# Patient Record
Sex: Female | Born: 1965 | Race: Black or African American | Hispanic: No | Marital: Married | Smoking: Never smoker
Health system: Southern US, Community
[De-identification: ages and names within clinical notes are randomized; demographics above are authoritative.]

## PROBLEM LIST (undated history)

## (undated) DIAGNOSIS — T8859XA Other complications of anesthesia, initial encounter: Secondary | ICD-10-CM

## (undated) DIAGNOSIS — T4145XA Adverse effect of unspecified anesthetic, initial encounter: Secondary | ICD-10-CM

## (undated) DIAGNOSIS — R112 Nausea with vomiting, unspecified: Secondary | ICD-10-CM

## (undated) DIAGNOSIS — G5600 Carpal tunnel syndrome, unspecified upper limb: Secondary | ICD-10-CM

## (undated) DIAGNOSIS — R7309 Other abnormal glucose: Secondary | ICD-10-CM

## (undated) DIAGNOSIS — D72819 Decreased white blood cell count, unspecified: Secondary | ICD-10-CM

## (undated) DIAGNOSIS — N3289 Other specified disorders of bladder: Secondary | ICD-10-CM

## (undated) DIAGNOSIS — Z789 Other specified health status: Secondary | ICD-10-CM

## (undated) DIAGNOSIS — E010 Iodine-deficiency related diffuse (endemic) goiter: Secondary | ICD-10-CM

## (undated) DIAGNOSIS — N2889 Other specified disorders of kidney and ureter: Secondary | ICD-10-CM

## (undated) DIAGNOSIS — R2240 Localized swelling, mass and lump, unspecified lower limb: Secondary | ICD-10-CM

## (undated) DIAGNOSIS — D219 Benign neoplasm of connective and other soft tissue, unspecified: Secondary | ICD-10-CM

## (undated) DIAGNOSIS — G4730 Sleep apnea, unspecified: Secondary | ICD-10-CM

## (undated) DIAGNOSIS — Z9889 Other specified postprocedural states: Secondary | ICD-10-CM

## (undated) HISTORY — PX: ABDOMINAL HYSTERECTOMY: SHX81

## (undated) HISTORY — DX: Decreased white blood cell count, unspecified: D72.819

## (undated) HISTORY — PX: DIAGNOSTIC LAPAROSCOPY: SUR761

## (undated) HISTORY — DX: Benign neoplasm of connective and other soft tissue, unspecified: D21.9

## (undated) HISTORY — DX: Other abnormal glucose: R73.09

## (undated) HISTORY — DX: Other specified disorders of kidney and ureter: N28.89

## (undated) HISTORY — DX: Other specified disorders of bladder: N32.89

## (undated) HISTORY — DX: Iodine-deficiency related diffuse (endemic) goiter: E01.0

## (undated) HISTORY — DX: Sleep apnea, unspecified: G47.30

---

## 1993-09-06 DIAGNOSIS — D72819 Decreased white blood cell count, unspecified: Secondary | ICD-10-CM

## 1993-09-06 HISTORY — DX: Decreased white blood cell count, unspecified: D72.819

## 1997-11-27 ENCOUNTER — Ambulatory Visit (HOSPITAL_COMMUNITY): Admission: RE | Admit: 1997-11-27 | Discharge: 1997-11-27 | Payer: Self-pay | Admitting: Obstetrics and Gynecology

## 1997-12-18 ENCOUNTER — Ambulatory Visit (HOSPITAL_COMMUNITY): Admission: RE | Admit: 1997-12-18 | Discharge: 1997-12-18 | Payer: Self-pay | Admitting: Obstetrics and Gynecology

## 1998-03-08 HISTORY — PX: OTHER SURGICAL HISTORY: SHX169

## 1998-04-02 ENCOUNTER — Inpatient Hospital Stay (HOSPITAL_COMMUNITY): Admission: RE | Admit: 1998-04-02 | Discharge: 1998-04-04 | Payer: Self-pay | Admitting: Obstetrics and Gynecology

## 1998-09-11 ENCOUNTER — Other Ambulatory Visit: Admission: RE | Admit: 1998-09-11 | Discharge: 1998-09-11 | Payer: Self-pay | Admitting: Obstetrics and Gynecology

## 1999-10-03 ENCOUNTER — Other Ambulatory Visit: Admission: RE | Admit: 1999-10-03 | Discharge: 1999-10-03 | Payer: Self-pay | Admitting: Obstetrics & Gynecology

## 1999-11-17 ENCOUNTER — Encounter: Payer: Self-pay | Admitting: Obstetrics and Gynecology

## 1999-11-17 ENCOUNTER — Ambulatory Visit (HOSPITAL_COMMUNITY): Admission: RE | Admit: 1999-11-17 | Discharge: 1999-11-17 | Payer: Self-pay | Admitting: Obstetrics and Gynecology

## 2000-01-20 ENCOUNTER — Inpatient Hospital Stay (HOSPITAL_COMMUNITY): Admission: AD | Admit: 2000-01-20 | Discharge: 2000-01-20 | Payer: Self-pay | Admitting: Obstetrics and Gynecology

## 2000-04-09 ENCOUNTER — Inpatient Hospital Stay (HOSPITAL_COMMUNITY): Admission: AD | Admit: 2000-04-09 | Discharge: 2000-04-12 | Payer: Self-pay | Admitting: Obstetrics and Gynecology

## 2000-04-14 ENCOUNTER — Encounter: Admission: RE | Admit: 2000-04-14 | Discharge: 2000-05-18 | Payer: Self-pay | Admitting: Obstetrics and Gynecology

## 2000-10-25 ENCOUNTER — Other Ambulatory Visit: Admission: RE | Admit: 2000-10-25 | Discharge: 2000-10-25 | Payer: Self-pay | Admitting: Obstetrics and Gynecology

## 2001-02-15 ENCOUNTER — Encounter: Payer: Self-pay | Admitting: Obstetrics and Gynecology

## 2001-02-15 ENCOUNTER — Encounter: Admission: RE | Admit: 2001-02-15 | Discharge: 2001-02-15 | Payer: Self-pay | Admitting: Obstetrics and Gynecology

## 2002-01-10 ENCOUNTER — Other Ambulatory Visit: Admission: RE | Admit: 2002-01-10 | Discharge: 2002-01-10 | Payer: Self-pay | Admitting: Obstetrics and Gynecology

## 2002-06-21 ENCOUNTER — Ambulatory Visit (HOSPITAL_COMMUNITY): Admission: RE | Admit: 2002-06-21 | Discharge: 2002-06-21 | Payer: Self-pay | Admitting: Obstetrics and Gynecology

## 2002-06-21 ENCOUNTER — Encounter: Payer: Self-pay | Admitting: Obstetrics and Gynecology

## 2002-09-04 ENCOUNTER — Encounter: Payer: Self-pay | Admitting: Obstetrics and Gynecology

## 2002-09-04 ENCOUNTER — Inpatient Hospital Stay (HOSPITAL_COMMUNITY): Admission: RE | Admit: 2002-09-04 | Discharge: 2002-09-04 | Payer: Self-pay | Admitting: Obstetrics and Gynecology

## 2002-11-16 ENCOUNTER — Inpatient Hospital Stay (HOSPITAL_COMMUNITY): Admission: AD | Admit: 2002-11-16 | Discharge: 2002-11-19 | Payer: Self-pay | Admitting: Obstetrics and Gynecology

## 2003-01-12 ENCOUNTER — Other Ambulatory Visit: Admission: RE | Admit: 2003-01-12 | Discharge: 2003-01-12 | Payer: Self-pay | Admitting: Obstetrics and Gynecology

## 2004-02-01 ENCOUNTER — Other Ambulatory Visit: Admission: RE | Admit: 2004-02-01 | Discharge: 2004-02-01 | Payer: Self-pay | Admitting: Obstetrics and Gynecology

## 2004-02-14 ENCOUNTER — Encounter: Admission: RE | Admit: 2004-02-14 | Discharge: 2004-02-14 | Payer: Self-pay | Admitting: Obstetrics and Gynecology

## 2005-02-18 ENCOUNTER — Encounter: Admission: RE | Admit: 2005-02-18 | Discharge: 2005-02-18 | Payer: Self-pay | Admitting: *Deleted

## 2005-02-18 ENCOUNTER — Other Ambulatory Visit: Admission: RE | Admit: 2005-02-18 | Discharge: 2005-02-18 | Payer: Self-pay | Admitting: *Deleted

## 2005-06-08 HISTORY — PX: TOTAL ABDOMINAL HYSTERECTOMY: SHX209

## 2006-02-19 ENCOUNTER — Encounter: Admission: RE | Admit: 2006-02-19 | Discharge: 2006-02-19 | Payer: Self-pay | Admitting: Obstetrics and Gynecology

## 2006-02-19 ENCOUNTER — Other Ambulatory Visit: Admission: RE | Admit: 2006-02-19 | Discharge: 2006-02-19 | Payer: Self-pay | Admitting: Obstetrics and Gynecology

## 2006-05-08 DIAGNOSIS — D219 Benign neoplasm of connective and other soft tissue, unspecified: Secondary | ICD-10-CM

## 2006-05-08 HISTORY — DX: Benign neoplasm of connective and other soft tissue, unspecified: D21.9

## 2006-05-11 ENCOUNTER — Encounter (INDEPENDENT_AMBULATORY_CARE_PROVIDER_SITE_OTHER): Payer: Self-pay | Admitting: *Deleted

## 2006-05-11 ENCOUNTER — Inpatient Hospital Stay (HOSPITAL_COMMUNITY): Admission: RE | Admit: 2006-05-11 | Discharge: 2006-05-13 | Payer: Self-pay | Admitting: Obstetrics and Gynecology

## 2007-04-22 ENCOUNTER — Encounter: Admission: RE | Admit: 2007-04-22 | Discharge: 2007-04-22 | Payer: Self-pay | Admitting: Obstetrics and Gynecology

## 2008-05-11 ENCOUNTER — Encounter: Admission: RE | Admit: 2008-05-11 | Discharge: 2008-05-11 | Payer: Self-pay | Admitting: Obstetrics and Gynecology

## 2008-05-11 ENCOUNTER — Other Ambulatory Visit: Admission: RE | Admit: 2008-05-11 | Discharge: 2008-05-11 | Payer: Self-pay | Admitting: Obstetrics & Gynecology

## 2009-05-17 ENCOUNTER — Encounter: Admission: RE | Admit: 2009-05-17 | Discharge: 2009-05-17 | Payer: Self-pay | Admitting: Obstetrics and Gynecology

## 2010-06-04 ENCOUNTER — Encounter
Admission: RE | Admit: 2010-06-04 | Discharge: 2010-06-04 | Payer: Self-pay | Source: Home / Self Care | Attending: Obstetrics and Gynecology | Admitting: Obstetrics and Gynecology

## 2010-10-24 NOTE — Discharge Summary (Signed)
   NAME:  Arellano Arellano MCCLEES                    ACCOUNT NO.:  [id number]   MEDICAL RECORD NO.:  [id number]                   PATIENT TYPE:  INP   LOCATION:  9112                                 FACILITY:  [facility]   PHYSICIAN:  Rica Koyanagi, C.N.M.         DATE OF BIRTH:  16-Sep-1965   DATE OF ADMISSION:  11/16/2002  DATE OF DISCHARGE:  11/19/2002                                 DISCHARGE SUMMARY   ADMISSION DIAGNOSES:  1. Intrauterine pregnancy at term.  2. Prior cesarean section.  3. Desires repeat cesarean section.  4. Prior myomectomy.   PROCEDURE:  Repeat low transverse cesarean section.   DISCHARGE DIAGNOSES:  1. Intrauterine pregnancy at term.  2. Prior cesarean section.  3. Desires repeat cesarean section.  4. Prior myomectomy.   HISTORY OF PRESENT ILLNESS:  Arellano Arellano is a 45 year old gravida 2, para  1, who presents at term with a history of prior myomectomy and prior  cesarean section for repeat cesarean section.   HOSPITAL COURSE:  This was performed by Dr. Marline Backbone with the birth  of an 8 pound 4 ounce female infant with Apgar scores of 9 at one minute and 9  at five minutes.  Both the patient and infant have done well in the  immediate postoperative period.  Baby is breast-feeding.  Mother's vital  signs have been stable.  Her hemoglobin on the first postoperative day was  10.6.  She has remained stable.  Her incision is clean and intact.  On this  her third postoperative day, she is judged to be in satisfactory condition  for discharge.   DISCHARGE INSTRUCTIONS:  Per [facility] handout.   DISCHARGE MEDICATIONS:  1. Motrin 600 mg p.o. q.6h. p.r.n. pain.  2. Tylox one or two p.o. q.3-4h. p.r.n. pain.  3. Prenatal vitamins.   FOLLOWUP:  At [facility] in six weeks.                                               Rica Koyanagi, C.N.M.    SDM/MEDQ  D:  11/19/2002  T:  11/19/2002  Job:  [id number] [id number]

## 2010-10-24 NOTE — Discharge Summary (Signed)
NAME:  Danielle Arellano, Danielle Arellano          ACCOUNT NO.:  [id number]   MEDICAL RECORD NO.:  [id number]          PATIENT TYPE:  INP   LOCATION:  9312                          [facility]   PHYSICIAN:  Cynthia P. Romine, M.D.DATE OF BIRTH:  1965-12-31   DATE OF ADMISSION:  05/11/2006  DATE OF DISCHARGE:  05/13/2006                               DISCHARGE SUMMARY   DISCHARGE DIAGNOSIS:  Multiple leiomyomas.   HISTORY OF PRESENT ILLNESS:  This is a 45 year old married black female  gravida 2, para 2, with a 20 week size uterus with fibroids on pelvic  ultrasound.  She was complaining of abdominal and pelvic pressure and  bloating and feeling of a huge abdomen.  She was amenorrheic with a  Mirena in place that the physician had been unable to retrieve in the  office.   HOSPITAL COURSE:  On May 11, 2006 the patient underwent a total  abdominal hysterectomy with lysis of adhesions.  There was an estimated  blood loss of 250 cc.  There were no complications.  Postoperatively,  the patient did very well.  She had no postoperative complications and  was sent home afebrile and in good condition on postoperative day #2.  She had been given a prescription for Percocet 5 mg to use at home for  postoperative pain.  Her pathology report did confirm leiomyomata.  The  uterus weighed 924 grams.   LABORATORY DATA:  On admission her H and H were 14 and 41 and on  discharge were 11.3 and 32.6.      Cynthia P. Romine, M.D.  Electronically Signed     CPR/MEDQ  D:  07/08/2006  T:  07/08/2006  Job:  [id number]

## 2010-10-24 NOTE — Op Note (Signed)
[facility]  Patient:    Danielle Arellano, Danielle Arellano                 MRN: [id number] Adm. Date:  [id number] Attending:  Leonard Schwartz                           Operative Report  PREOPERATIVE DIAGNOSES:       1. Term intrauterine pregnancy.                               2. Prior abdominal myomectomy.  POSTOPERATIVE DIAGNOSES:      1. Term intrauterine pregnancy.                               2. Prior abdominal myomectomy.  PROCEDURE:                    Primary low transverse cesarean section.  SURGEON:                      Janine Limbo, M.D.  FIRST ASSISTANT:              Sherald Barge, C.N.M.  ANESTHESIA:                   Spinal.  DISPOSITION:                  The patient is a 45 year old female gravida 1, para 0 who presents at [redacted] weeks gestation [facility] April 17, 2000) for cesarean delivery.  She has had a prior myomectomy with deep penetration of the myometrium.  She understands the potential for a vaginal delivery, but also understands the risks associated with vaginal delivery following such a myomectomy.  She has elected to proceed with cesarean section.  The risks associated with cesarean section were reviewed including, but not limited to, anesthetic complications, bleeding, infections, and possible damage to the surrounding organs.  FINDINGS:                     A 6 pound 3 ounce female infant (Lela) was delivered from a cephalic position.  The Apgars were 8 at one minute and 9 at five minutes.  There were small fibroids present on the left fundus of the uterus.  The ovaries were normal to palpation.  There were filmy adhesions between the bowel and the fundus of the uterus.  PROCEDURE:                    The patient was taken to the operating room where a spinal anesthetic was given.  The patients abdomen, perineum, and vagina were prepped with multiple layers of Betadine.  A Foley catheter was placed in the bladder.  The  patient was sterilely draped.  A low transverse incision was made in the abdomen and carried sharply through the subcutaneous tissue, the fascia, and the anterior peritoneum.  An incision was made in the lower uterine segment and extended transversely.  The fetal head was delivered with the assistance of a Mighty Vac vacuum extractor.  The mouth and nose were suctioned.  A nuchal cord was reduced.  The remainder of the infant was delivered.  The cord was clamped and cut and the infant was handed to the awaiting pediatric  team.  Routine cord blood studies were obtained.  The placenta was removed.  The uterine cavity was cleaned of amniotic fluid and clotted blood.  The uterine incision was closed using a running locking suture of 2-0 Vicryl.  Hemostasis was adequate.  The peritoneal cavity was irrigated. The anterior peritoneum and the abdominal musculature were reapproximated using 2-0 Vicryl.  The fascia was closed using a running suture of 0 Vicryl followed by three interrupted sutures of 0 Vicryl.  The subcutaneous tissue was irrigated.  Hemostasis was adequate.  The skin was reapproximated using skin staples.  Sponge, needle, and instrument counts were correct on two occasions.  Estimated blood loss was 600 cc.  The patient tolerated her procedure well.  She was taken to the recovery room in stable condition.  The infant was taken to the full-term nursery in stable condition. DD:  04/09/00 TD:  04/09/00 Job: [id number]

## 2010-10-24 NOTE — H&P (Signed)
[facility]  Patient:    Danielle Arellano, Danielle Arellano              MRN: [id number] Adm. Date:  [id number] Disc. Date: [id number] Attending:  Shaune Spittle                         History and Physical  HISTORY OF PRESENT ILLNESS:   The patient is a 45 year old female, gravida 1, para 0, who presents at [redacted] weeks gestation, [facility] April 17, 2000, for a cesarean delivery.  The patient had been followed at [facility] for this pregnancy, that has been complicated by a history of fibroids.  She is status post a myomectomy two years ago.  Her age is also greater than 35, but she declined an amniocentesis.  PAST MEDICAL HISTORY:         The patient has a known history of low white blood cells.  The patient had her wisdom teeth removed approximately 15 years ago, in addition to the myomectomy that was performed in 1999.  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  She denies cigarette use, alcohol use, and recreational drug use.  REVIEW OF SYSTEMS:            Normal pregnancy complaints.  FAMILY HISTORY:               The patient has a family history of emphysema and seizure disorders.  PHYSICAL EXAMINATION:  GENERAL:                      Weight is 161 pounds.  HEENT:                        Within normal limits.  CHEST:                        Clear.  HEART:                        A regular rate and rhythm.  BREASTS:                      Without masses.  ABDOMEN:                      Gravid with a fundal height of 37 cm.  EXTREMITIES:                  Within normal limits.  NEUROLOGIC:                   Grossly normal.  PELVIC:                       Cervix was closed and long when it was last checked.  LABORATORY DATA:              Blood type is O-negative.  (The patient received RhoGAM at 28 weeks.)  Antibody screen negative.  Sickle cell trait  negative. VDRL nonreactive.  Rubella immune.  HBSAG negative.  GC negative.  Chlamydia negative.  Pap smear within normal limits.  Alpha fetoprotein within normal limits.  Third trimester beta Streptococcus screen negative.  ASSESSMENT:  1. A 39-week gestation.                               2. Prior myomectomy.                               3. Fibroids.                               4. Age greater than 35.                               5. Blood type O-negative.  PLAN:                         The patient will undergo a low transverse cesarean section.  She understands the indications for her procedure, and she accepts the risks of, but not limited to, anesthetic complications, bleeding, infections, and possible damage to the surrounding organs.  The patient understands that we will check the infants blood type, and she may need RhoGAM. DD:  04/08/00 TD:  04/08/00 Job: [id number]

## 2010-10-24 NOTE — Op Note (Signed)
NAME:  Danielle Arellano, Danielle Arellano NO.:  [id number]   MEDICAL RECORD NO.:  [id number]                   PATIENT TYPE:  INP   LOCATION:  9199                                 [facility]   PHYSICIAN:  Janine Limbo, M.D.            DATE OF BIRTH:  03-26-1966   DATE OF PROCEDURE:  11/16/2002  DATE OF DISCHARGE:                                 OPERATIVE REPORT   PREOPERATIVE DIAGNOSES:  1. Term intrauterine pregnancy.  2. Prior cesarean section.  3. Prior myomectomy.  4. Desires repeat cesarean section.   POSTOPERATIVE DIAGNOSES:  1. Term intrauterine pregnancy.  2. Prior cesarean section.  3. Prior myomectomy.  4. Desires repeat cesarean section.   PROCEDURE:  Repeat low transverse cesarean section.   SURGEON:  Janine Limbo, M.D.   FIRST ASSISTANT:  Renaldo Reel. Emilee Hero, C.N.M.   ANESTHESIA:  Spinal.   DISPOSITION:  Danielle Arellano is a 45 year old female, gravida 2, para 1-0-0-1,  who presents with the above mentioned diagnosis.  She understands the  indications for her procedure and she accepts the risk of, but not limited  to, anesthetic complications, bleeding, infections, and possible damage to  the surrounding organs.   FINDINGS:  An 8 pound 4 ounce female infant (name currently not known) was  delivered from a cephalic  presentation.  The Apgars were 9 at one minute  and 9 at five minutes.  There were adhesions noted between the uterus and  the adnexa.  Otherwise the tubes and ovaries appeared normal.   DESCRIPTION OF PROCEDURE:  The patient was taken to the operating room where  a spinal anesthetic was given.  The patient's abdomen, perineum, and outer  vagina were prepped with multiple layers of Betadine.  A Foley catheter was  placed in the bladder.  The patient was sterilely draped.  A low transverse  incision was made in the abdomen and carried sharply through the  subcutaneous tissue, the fascia, and the anterior peritoneum.  An  incision  was made in the lower uterine segment and extended transversely.  Fetal head  was delivered with the assistance of a Mityvac vacuum extractor.  The mouth  and nose were suctioned.  The remainder of the infant was delivered.  The  cord was clamped and cut and the infant was handed to the awaiting pediatric  team.  Routine cord blood studies were obtained.  The placenta was removed.  The uterine cavity was cleaned of amniotic fluid and clotted blood.  The  uterine incision was closed using a running locking suture of 2-0 Vicryl  followed by an imbricating suture of 2-0 Vicryl.  Hemostasis was adequate.  The pelvic cavity was vigorously irrigated.  The anterior peritoneum and the  abdominal musculature were reapproximated in the midline using interrupted  sutures of 0 Vicryl.  The abdominal musculature, the fascia, and the  subcutaneous layer were irrigated.  Hemostasis was adequate.  The fascia was  closed using a running suture of 0 Vicryl followed by three interrupted  sutures of 0 Vicryl.  The subcutaneous layer was closed using a running  suture of 2-0 Vicryl.  The skin was reapproximated using subcuticular suture  of 4-0 Vicryl.  Sponge, needle and instrument counts were correct on two  occasions.  The estimated blood loss was 800 mL.  The patient tolerated the  procedure well.  She was taken to the recovery room in stable condition.  The infant was taken to the full-term nursery in stable condition.                                               Janine Limbo, M.D.    AVS/MEDQ  D:  11/16/2002  T:  11/16/2002  Job:  [id number]   cc:   Laqueta Linden, M.D.  [address]  [place]  [place] [address]  Fax: [phone]

## 2010-10-24 NOTE — Discharge Summary (Signed)
[facility]  Patient:    Danielle, Arellano                 MRN: [id number] Adm. Date:  [id number] Disc. Date: 04/12/00 Attending:  Leonard Schwartz Dictator:   Miguel Dibble, C.N.M.                           Discharge Summary  DATE OF BIRTH:                Sep 28, 1965.  ADMISSION DIAGNOSES:          1. Term pregnancy.                               2. Prior myomectomy.                               3. Primigravida.  DISCHARGE DIAGNOSES:          1. Term pregnancy.                               2. Prior myomectomy.                               3. Primigravida.                               4. Delivered by primary cesarean section, viable                                  baby girl weighing 6 pounds 3 ounces, Apgars                                  of 8 and 9.                               5. Small fibroid on uterus.                               6. Adhesions of the bowel.  PROCEDURES:                   1. Spinal anesthesia.                               2. Primary lower segment transverse cesarean                                  section.  HOSPITAL COURSE:              On April 09, 2000, Danielle Arellano was admitted for a planned primary cesarean section for a history of a previous myomectomy.  She is a primigravida.  She delivered a baby girl, uncomplicated, weighing 6 pounds 3 ounces, Apgars of 8 and 9.  A small fibroid was  noted on her uterus, with adhesions to the bowel.  She recovered well.  On postoperative day #1, she was ambulating without difficulty, tolerating clear fluids, breast-feeding well.  She planned oral contraceptive pills for contraception while breast-feeding.  On postoperative day #1, hemoglobin had dropped from 13 to 10, hematocrit 27.8, platelets 195, white count 9.6.  On postoperative day #2, her incision was clean, dry, and intact.  The baby was breast-feeding well.  She was afebrile.  On postoperative  day #3, on April 12, 2000, she continued to recover well and was discharged home in stable condition after her staples were removed and Steri-Strips applied.  DISCHARGE INSTRUCTIONS:       Per [employer].  DISCHARGE MEDICATIONS:        Motrin 600 mg, Tylox, Micronor, prenatal vitamins.  FOLLOW-UP:                    In six weeks at [facility].DD: 04/12/00 TD:  04/12/00 Job: [id number]

## 2010-10-24 NOTE — H&P (Signed)
NAME:  Halterman, ANGELISSE RISO NO.:  [id number]   MEDICAL RECORD NO.:  [id number]                   PATIENT TYPE:  INP   LOCATION:  NA                                   [facility]   PHYSICIAN:  Janine Limbo, M.D.            DATE OF BIRTH:  August 24, 1965   DATE OF ADMISSION:  11/16/2002  DATE OF DISCHARGE:                                HISTORY & PHYSICAL   HISTORY OF PRESENT ILLNESS:  Ms. Hogland is a 45 year old female, gravida  2, para 1-0-0-1, who presents at [redacted] weeks gestation (EDC is November 21, 2002)  for repeat cesarean section.  The patient has been followed at the [facility].  Her pregnancy has been largely uncomplicated.  She has had a prior  cesarean section, and a prior myomectomy.  She plans a repeat cesarean  section.  The patient is RH negative, and she did receive RhoGAM at [redacted] weeks  gestation.   OBSTETRICAL HISTORY:  In 2001, the patient had a primary cesarean section  because of a prior myomectomy.  She delivered a 6 pound 3 ounce female  infant (Lela).   ALLERGIES:  No known drug allergies.   PAST MEDICAL HISTORY:  The patient denies hypertension and diabetes.  She  had her wisdom teeth removed when she was 45 years old.  She had a prior a  myomectomy in 1999, and a cesarean section in 2001.   SOCIAL HISTORY:  The patient denies cigarette use, alcohol use, an  recreational drug use.  She is a married Engineer, site.   REVIEW OF SYMPTOMS:  Normal pregnancy complaints.   FAMILY HISTORY:  Noncontributory.   PHYSICAL EXAMINATION:  VITAL SIGNS:  Weight is 169 pounds.  HEENT:  Within normal limits.  CHEST:  Clear.  HEART:  Regular rate and rhythm.  BREASTS:  Without masses.  ABDOMEN:  Gravid with a fundal height of 37 cm.  EXTREMITIES:  Within normal limits.  NEUROLOGIC:  Grossly normal.  PELVIC:  Cervix is closed and long.   LABORATORY DATA:  Blood  type is O negative.  Antibody screen is negative.  Sickle cell trait negative.  VDRL nonreactive.  Rubella immune.  Hepatitis B  surface antigen negative.  Glucola screen within normal limits.  Alpha  fetoprotein screen within normal limits.  Third trimester beta Strep  negative.  Third trimester gonorrhea negative.  Third trimester Chlamydia  negative.   ASSESSMENT:  1. A [redacted] week gestation.  2. Prior cesarean section.  3. Desires repeat cesarean section.  4. Prior myomectomy.  5. Rh negative.   PLAN:  The patient will undergo a repeat low transverse cesarean section.  She is considering an IUD for contraception.  We will check the infant's  blood type, and then determine whether the mother needs RhoGAM.  Janine Limbo, M.D.    AVS/MEDQ  D:  11/12/2002  T:  11/12/2002  Job:  [id number]   cc:   Laqueta Linden, M.D.  [address]  [place]  [place] [address]  Fax: [phone]   Dellis Anes. Idell Pickles, M.D.  [address]  [place]  [place] [address]  Fax: [phone]

## 2010-10-24 NOTE — Op Note (Signed)
NAME:  Danielle Arellano, Danielle Arellano          ACCOUNT NO.:  [id number]   MEDICAL RECORD NO.:  [id number]          PATIENT TYPE:  INP   LOCATION:  9312                          FACILITY:  [facility]   PHYSICIAN:  Cynthia P. Romine, M.D.DATE OF BIRTH:  December 27, 1965   DATE OF PROCEDURE:  05/11/2006  DATE OF DISCHARGE:                               OPERATIVE REPORT   PREOPERATIVE DIAGNOSIS:  Uterine fibroids, symptomatic, with protuberant  abdomen, pelvic pressure, abdominal bloating.   POSTOPERATIVE DIAGNOSIS:  Uterine fibroids, symptomatic, with  protuberant abdomen, pelvic pressure, abdominal bloating, path pending.   PROCEDURE:  Total abdominal hysterectomy, extensive lysis of adhesions.   SURGEON:  Dr. Arline Asp Romine.   ASSISTANT:  Dr. Leda Quail.   ANESTHESIA:  General endotracheal.   ESTIMATED BLOOD LOSS:  250 mL.   COMPLICATIONS:  None.   PROCEDURE:  The patient was taken to the recovery room and, after she  was given IV sedation, she was prepped and draped, and Foley catheter  inserted, and then anesthesia was induced.  The anesthesiologist was  slightly held up in another situation and, therefore, the patient was  prepped prior to the induction of anesthesia.  Anesthesia was induced.  The patient was then draped.  A vertical incision was made in the  symphysis to below the umbilicus.  The incision was carried down to the  fascia using the Bovie.  The fascia was nicked and opened vertically.  The rectus muscles were separated bluntly in the midline.  Underlying  peritoneum was entered atraumatically.  Peritoneum was opened  vertically.  The patient had had a previous myomectomy which involved  multiple myomata, and had two previous C-sections.  Her uterus was above  the umbilicus, was multinodular.  The right ovary and tube were densely  adherent to the sidewall of the uterus.  The fimbria of the tube  initially was not even visible until after dissecting away some of the  adhesions.   The round ligaments were not visible as well.  There was a  large amount of small intestine densely adherent to the fundus and  posterior surface of the uterus on the right.  On the left there were  adhesions of the colon and the small bowel to the uterus.  As well, the  bladder was adherent up high onto the uterine fundus.  Very careful  dissection was used to remove the bowel that was adherent to the uterus.  It was quite tedious and time-consuming.  The bowel was finally freed  off the right side of the uterus.  There was one section of small  intestine where there was a question if there was a break in the serosa  of the small bowel from the dissection, and in this piece of small bowel  a very superficial figure-of-eight suture of 2-0 silk was placed, just  for reinforcement of the serosa.  It may have been even not necessary.  It was just done for added safety.  The ovary and tube were still  densely adherent to the uterus and, as the patient is 45 years old and  wanting to keep her ovaries, extensive  dissection was necessary to  remove the tube and ovary from the uterus, identifying the  infundibulopelvic ligament and protecting it.  Ureter was identified.  On the left the adhesions were taken down sharply as well, and the left  side then could be put on tension and, when that was done, the round  ligament could be identified.  Round ligament was very attenuated,  presumably because the large size of the uterus.  When the uterus was  put on tension, the round ligament could be identified, was elevated  with Kelly clamp, stitched and divided with the Bovie.  This allowed the  anterior leaf of broad ligament to be taken down sharply and, after  dissecting some adhesions from the tube and ovary on the left of the  uterus, a window could be obtained and the mesosalpinx and a Heaney  clamp was used to clamp the pedicle containing the utero-ovarian, tube  and round ligaments, cut and  doubly tied.  The bladder was dissected  with difficulty off the uterus.  There were thick and multiple adhesions  of the bladder up onto the uterus and the surrounding fibroids.  On the  left, the uterine artery was skeletonized, doubly clamped and doubly  tied.  On the right, once the dissection was able to be completed,  freeing the ovary and tube from the uterus, and we could put traction on  the uterus towards the left, we could identify the right round ligament  which was elevated with Tresa Endo.  It was attenuated as well.  It was  stitched, opened with the Bovie.  Anterior leaf of broad ligament taken  down sharply.  This helped find the plane that enabled [place] to take down  the bladder off the cervix with the fibroid.  A window was then made in  the mesosalpinx of the pedicle containing the right tube, utero-ovarian  ligament and round was doubly clamped, cut and doubly tied.  The uterine  artery on the right was then skeletonized, doubly clamped and doubly  tied.  There were cervical fibroids, especially on the right, that made  continuing down the cardinal ligaments difficult.  The adhesions from  the sidewall to the fibroids were dissected free.  Straight Heaney were  used down the uterus on both sides, clamping, cutting and tying in  sequence.  On the left, I was able to clamp the uterosacral ligaments  with a curved Heaney and, as it was cut, the vagina was entered.  The  pedicle was held after it was sutured with 0 Vicryl.  The specimen was  then removed with Jorgenson's scissors, grasping the margins of the  vagina with Kochers.  Angle suture were placed at the right and left  angles.  The right uterine artery had been previously skeletonized,  clamped and doubly tied.  The vagina was closed with figure-of-eight  sutures of 0 Vicryl.  The pelvis was irrigated.  There were some small  bleeders that were controlled with the Bovie.  The pelvis was felt to be hemostatic.  There was  some oozing underneath the bladder where the  extensive dissection had been done, and a piece of Gelfoam was placed  here for hemostasis.  The ovaries were tied up to the round ligament.  The bowel was allowed to return to its anatomic position.  The  peritoneum was grasped with hemostats and closed in running fashion  using 2-0 Vicryl.  A subfascial On-Q catheter was placed.  The fascia  was then closed from the top and the bottom to the midline using 0  Vicryl in a running suture.  The subcutaneous On-Q catheter was placed.  Subcutaneous tissue was irrigated.  Hemostasis was achieved with the  Bovie.  The skin was approximated with subcuticular suture of 4-0 Vicryl  Rapide.  Benzoin and Steri-Strips were applied.  The On-Q catheters were  secured, and the procedure was terminated.  Sponge, needle and  instrument counts were correct x3.      Cynthia P. Romine, M.D.  Electronically Signed     CPR/MEDQ  D:  05/11/2006  T:  05/12/2006  Job:  [id number]

## 2011-06-19 ENCOUNTER — Other Ambulatory Visit: Payer: Self-pay | Admitting: Obstetrics and Gynecology

## 2011-06-19 DIAGNOSIS — Z1231 Encounter for screening mammogram for malignant neoplasm of breast: Secondary | ICD-10-CM

## 2011-06-30 ENCOUNTER — Ambulatory Visit: Payer: Self-pay

## 2011-07-03 ENCOUNTER — Ambulatory Visit
Admission: RE | Admit: 2011-07-03 | Discharge: 2011-07-03 | Disposition: A | Discharge status: Home / Self Care | Payer: BC Managed Care – PPO | Source: Ambulatory Visit | Attending: Obstetrics and Gynecology | Admitting: Obstetrics and Gynecology

## 2011-07-03 DIAGNOSIS — Z1231 Encounter for screening mammogram for malignant neoplasm of breast: Secondary | ICD-10-CM

## 2012-01-10 ENCOUNTER — Other Ambulatory Visit: Payer: Self-pay | Admitting: Orthopedic Surgery

## 2012-01-19 ENCOUNTER — Encounter (HOSPITAL_BASED_OUTPATIENT_CLINIC_OR_DEPARTMENT_OTHER): Payer: Self-pay | Admitting: *Deleted

## 2012-01-19 NOTE — Progress Notes (Signed)
No labs needed

## 2012-01-22 ENCOUNTER — Encounter (HOSPITAL_BASED_OUTPATIENT_CLINIC_OR_DEPARTMENT_OTHER): Payer: Self-pay | Admitting: Anesthesiology

## 2012-01-22 ENCOUNTER — Encounter (HOSPITAL_BASED_OUTPATIENT_CLINIC_OR_DEPARTMENT_OTHER): Payer: Self-pay | Admitting: Orthopedic Surgery

## 2012-01-22 ENCOUNTER — Encounter (HOSPITAL_BASED_OUTPATIENT_CLINIC_OR_DEPARTMENT_OTHER)
Admission: RE | Disposition: A | Discharge status: Home / Self Care | Payer: Self-pay | Source: Ambulatory Visit | Attending: Orthopedic Surgery

## 2012-01-22 ENCOUNTER — Ambulatory Visit (HOSPITAL_BASED_OUTPATIENT_CLINIC_OR_DEPARTMENT_OTHER): Payer: BC Managed Care – PPO | Admitting: Anesthesiology

## 2012-01-22 ENCOUNTER — Encounter (HOSPITAL_BASED_OUTPATIENT_CLINIC_OR_DEPARTMENT_OTHER): Payer: Self-pay | Admitting: *Deleted

## 2012-01-22 ENCOUNTER — Ambulatory Visit (HOSPITAL_BASED_OUTPATIENT_CLINIC_OR_DEPARTMENT_OTHER)
Admission: RE | Admit: 2012-01-22 | Discharge: 2012-01-22 | Disposition: A | Discharge status: Home / Self Care | Payer: BC Managed Care – PPO | Source: Ambulatory Visit | Attending: Orthopedic Surgery | Admitting: Orthopedic Surgery

## 2012-01-22 DIAGNOSIS — M6740 Ganglion, unspecified site: Secondary | ICD-10-CM | POA: Insufficient documentation

## 2012-01-22 DIAGNOSIS — R2240 Localized swelling, mass and lump, unspecified lower limb: Secondary | ICD-10-CM

## 2012-01-22 HISTORY — PX: MASS EXCISION: SHX2000

## 2012-01-22 HISTORY — DX: Other specified postprocedural states: Z98.890

## 2012-01-22 HISTORY — DX: Adverse effect of unspecified anesthetic, initial encounter: T41.45XA

## 2012-01-22 HISTORY — DX: Localized swelling, mass and lump, unspecified lower limb: R22.40

## 2012-01-22 HISTORY — DX: Nausea with vomiting, unspecified: R11.2

## 2012-01-22 HISTORY — DX: Other complications of anesthesia, initial encounter: T88.59XA

## 2012-01-22 HISTORY — DX: Other specified health status: Z78.9

## 2012-01-22 HISTORY — DX: Carpal tunnel syndrome, unspecified upper limb: G56.00

## 2012-01-22 SURGERY — EXCISION MASS
Anesthesia: General | Site: Foot | Laterality: Right | Wound class: Clean

## 2012-01-22 MED ORDER — OXYCODONE HCL 5 MG PO TABS: 5.0000 mg | ORAL_TABLET | Freq: Once | ORAL | Status: DC | PRN

## 2012-01-22 MED ORDER — OXYCODONE-ACETAMINOPHEN 5-325 MG PO TABS: 1.0000 | ORAL_TABLET | Freq: Four times a day (QID) | ORAL | Status: AC | PRN

## 2012-01-22 MED ORDER — CEFAZOLIN SODIUM-DEXTROSE 2-3 GM-% IV SOLR
2.0000 g | INTRAVENOUS | Status: AC
  Administered 2012-01-22: 2 g via INTRAVENOUS

## 2012-01-22 MED ORDER — FENTANYL CITRATE 0.05 MG/ML IJ SOLN
INTRAMUSCULAR | Status: DC | PRN
  Administered 2012-01-22 (×2): 25 ug via INTRAVENOUS
  Administered 2012-01-22: 50 ug via INTRAVENOUS

## 2012-01-22 MED ORDER — LACTATED RINGERS IV SOLN
INTRAVENOUS | Status: DC
  Administered 2012-01-22: 07:00:00 via INTRAVENOUS

## 2012-01-22 MED ORDER — KETOROLAC TROMETHAMINE 30 MG/ML IJ SOLN
INTRAMUSCULAR | Status: DC | PRN
  Administered 2012-01-22: 30 mg via INTRAVENOUS

## 2012-01-22 MED ORDER — LIDOCAINE HCL (CARDIAC) 20 MG/ML IV SOLN
INTRAVENOUS | Status: DC | PRN
  Administered 2012-01-22: 60 mg via INTRAVENOUS

## 2012-01-22 MED ORDER — MIDAZOLAM HCL 5 MG/5ML IJ SOLN
INTRAMUSCULAR | Status: DC | PRN
  Administered 2012-01-22: 2 mg via INTRAVENOUS

## 2012-01-22 MED ORDER — BUPIVACAINE HCL (PF) 0.5 % IJ SOLN
INTRAMUSCULAR | Status: DC | PRN
  Administered 2012-01-22: 10 mL

## 2012-01-22 MED ORDER — ONDANSETRON HCL 4 MG/2ML IJ SOLN
INTRAMUSCULAR | Status: DC | PRN
  Administered 2012-01-22: 4 mg via INTRAVENOUS

## 2012-01-22 MED ORDER — PROPOFOL 10 MG/ML IV EMUL
INTRAVENOUS | Status: DC | PRN
  Administered 2012-01-22: 30 mg via INTRAVENOUS
  Administered 2012-01-22: 200 mg via INTRAVENOUS

## 2012-01-22 MED ORDER — FENTANYL CITRATE 0.05 MG/ML IJ SOLN: 25.0000 ug | INTRAMUSCULAR | Status: DC | PRN

## 2012-01-22 MED ORDER — DEXAMETHASONE SODIUM PHOSPHATE 4 MG/ML IJ SOLN
INTRAMUSCULAR | Status: DC | PRN
  Administered 2012-01-22: 10 mg via INTRAVENOUS

## 2012-01-22 MED ORDER — PROMETHAZINE HCL 25 MG PO TABS: 25.0000 mg | ORAL_TABLET | Freq: Four times a day (QID) | ORAL | Status: DC | PRN

## 2012-01-22 MED ORDER — SCOPOLAMINE 1 MG/3DAYS TD PT72
1.0000 | MEDICATED_PATCH | Freq: Once | TRANSDERMAL | Status: DC
  Administered 2012-01-22: 1.5 mg via TRANSDERMAL

## 2012-01-22 MED ORDER — OXYCODONE HCL 5 MG/5ML PO SOLN: 5.0000 mg | Freq: Once | ORAL | Status: DC | PRN

## 2012-01-22 MED ORDER — 0.9 % SODIUM CHLORIDE (POUR BTL) OPTIME
TOPICAL | Status: DC | PRN
  Administered 2012-01-22: 1000 mL

## 2012-01-22 MED ORDER — METOCLOPRAMIDE HCL 5 MG/ML IJ SOLN: 10.0000 mg | Freq: Once | INTRAMUSCULAR | Status: DC | PRN

## 2012-01-22 MED ORDER — ACETAMINOPHEN 10 MG/ML IV SOLN
1000.0000 mg | Freq: Once | INTRAVENOUS | Status: AC
  Administered 2012-01-22: 1000 mg via INTRAVENOUS

## 2012-01-22 SURGICAL SUPPLY — 48 items
APPLICATOR SEPP 2% TINCTURE (FORM) ×1 IMPLANT
BANDAGE ELASTIC 3 VELCRO ST LF (GAUZE/BANDAGES/DRESSINGS) ×1 IMPLANT
BANDAGE ELASTIC 4 VELCRO ST LF (GAUZE/BANDAGES/DRESSINGS) ×1 IMPLANT
BLADE SURG 15 STRL LF DISP TIS (BLADE) ×1 IMPLANT
BLADE SURG 15 STRL SS (BLADE) ×2
BNDG CMPR 9X4 STRL LF SNTH (GAUZE/BANDAGES/DRESSINGS)
BNDG ESMARK 4X9 LF (GAUZE/BANDAGES/DRESSINGS) IMPLANT
CLOTH BEACON ORANGE TIMEOUT ST (SAFETY) ×2 IMPLANT
CORDS BIPOLAR (ELECTRODE) ×2 IMPLANT
COVER TABLE BACK 60X90 (DRAPES) ×2 IMPLANT
CUFF TOURNIQUET SINGLE 18IN (TOURNIQUET CUFF) IMPLANT
CUFF TOURNIQUET SINGLE 34IN LL (TOURNIQUET CUFF) ×1 IMPLANT
DRAPE EXTREMITY T 121X128X90 (DRAPE) ×2 IMPLANT
DRAPE SURG 17X23 STRL (DRAPES) ×2 IMPLANT
DRAPE U 20/CS (DRAPES) ×2 IMPLANT
DURAPREP 26ML APPLICATOR (WOUND CARE) ×2 IMPLANT
ELECT REM PT RETURN 9FT ADLT (ELECTROSURGICAL) ×2
ELECTRODE REM PT RTRN 9FT ADLT (ELECTROSURGICAL) IMPLANT
GLOVE BIO SURGEON STRL SZ 6.5 (GLOVE) ×1 IMPLANT
GLOVE BIO SURGEON STRL SZ7.5 (GLOVE) ×2 IMPLANT
GLOVE BIOGEL PI IND STRL 7.0 (GLOVE) IMPLANT
GLOVE BIOGEL PI IND STRL 8 (GLOVE) ×2 IMPLANT
GLOVE BIOGEL PI INDICATOR 7.0 (GLOVE) ×1
GLOVE BIOGEL PI INDICATOR 8 (GLOVE) ×2
GLOVE ORTHO TXT STRL SZ7.5 (GLOVE) ×2 IMPLANT
GLOVE SURG ORTHO 8.0 STRL STRW (GLOVE) ×2 IMPLANT
GOWN PREVENTION PLUS XLARGE (GOWN DISPOSABLE) ×3 IMPLANT
GOWN STRL REIN 2XL LVL4 (GOWN DISPOSABLE) ×1 IMPLANT
NDL HYPO 25X1 1.5 SAFETY (NEEDLE) ×1 IMPLANT
NEEDLE HYPO 25X1 1.5 SAFETY (NEEDLE) ×2 IMPLANT
NS IRRIG 1000ML POUR BTL (IV SOLUTION) ×2 IMPLANT
PACK BASIN DAY SURGERY FS (CUSTOM PROCEDURE TRAY) ×2 IMPLANT
PAD CAST 3X4 CTTN HI CHSV (CAST SUPPLIES) ×1 IMPLANT
PADDING CAST ABS 3INX4YD NS (CAST SUPPLIES)
PADDING CAST ABS 4INX4YD NS (CAST SUPPLIES)
PADDING CAST ABS COTTON 3X4 (CAST SUPPLIES) ×1 IMPLANT
PADDING CAST ABS COTTON 4X4 ST (CAST SUPPLIES) ×1 IMPLANT
PADDING CAST COTTON 3X4 STRL (CAST SUPPLIES)
PENCIL BUTTON HOLSTER BLD 10FT (ELECTRODE) ×1 IMPLANT
SLEEVE SCD COMPRESS KNEE MED (MISCELLANEOUS) ×1 IMPLANT
SPONGE GAUZE 4X4 12PLY (GAUZE/BANDAGES/DRESSINGS) ×2 IMPLANT
STRIP CLOSURE SKIN 1/2X4 (GAUZE/BANDAGES/DRESSINGS) ×1 IMPLANT
SUT ETHILON 4 0 PS 2 18 (SUTURE) ×1 IMPLANT
SYR BULB 3OZ (MISCELLANEOUS) ×2 IMPLANT
SYR CONTROL 10ML LL (SYRINGE) ×2 IMPLANT
TOWEL OR 17X24 6PK STRL BLUE (TOWEL DISPOSABLE) ×2 IMPLANT
UNDERPAD 30X30 INCONTINENT (UNDERPADS AND DIAPERS) ×2 IMPLANT
WATER STERILE IRR 1000ML POUR (IV SOLUTION) ×1 IMPLANT

## 2012-01-22 NOTE — Anesthesia Preprocedure Evaluation (Signed)
Anesthesia Evaluation  Patient identified by MRN, date of birth, ID band Patient awake    Reviewed: Allergy & Precautions, H&P , NPO status , Patient's Chart, lab work & pertinent test results, reviewed documented beta blocker date and time   History of Anesthesia Complications (+) PONV  Airway Mallampati: II TM Distance: >3 FB Neck ROM: full    Dental   Pulmonary neg pulmonary ROS,  breath sounds clear to auscultation        Cardiovascular negative cardio ROS  Rhythm:regular     Neuro/Psych  Neuromuscular disease negative psych ROS   GI/Hepatic negative GI ROS, Neg liver ROS,   Endo/Other  negative endocrine ROS  Renal/GU negative Renal ROS  negative genitourinary   Musculoskeletal   Abdominal   Peds  Hematology negative hematology ROS (+)   Anesthesia Other Findings See surgeon's H&P   Reproductive/Obstetrics negative OB ROS                           Anesthesia Physical Anesthesia Plan  ASA: II  Anesthesia Plan: General   Post-op Pain Management:    Induction: Intravenous  Airway Management Planned: LMA  Additional Equipment:   Intra-op Plan:   Post-operative Plan: Extubation in OR  Informed Consent: I have reviewed the patients History and Physical, chart, labs and discussed the procedure including the risks, benefits and alternatives for the proposed anesthesia with the patient or authorized representative who has indicated his/her understanding and acceptance.   Dental Advisory Given  Plan Discussed with: CRNA and Surgeon  Anesthesia Plan Comments:         Anesthesia Quick Evaluation

## 2012-01-22 NOTE — Transfer of Care (Signed)
Immediate Anesthesia Transfer of Care Note  Patient: Danielle Arellano  Procedure(s) Performed: Procedure(s) (LRB): EXCISION MASS (Right)  Patient Location: PACU  Anesthesia Type: General  Level of Consciousness: awake  Airway & Oxygen Therapy: Patient Spontanous Breathing and Patient connected to face mask oxygen  Post-op Assessment: Report given to PACU RN and Post -op Vital signs reviewed and stable  Post vital signs: Reviewed and stable  Complications: No apparent anesthesia complications

## 2012-01-22 NOTE — Op Note (Signed)
01/22/2012  8:33 AM  PATIENT:  Danielle Arellano    PRE-OPERATIVE DIAGNOSIS:  RIGHT FOOT GANGLION   POST-OPERATIVE DIAGNOSIS:  Same  PROCEDURE:  EXCISION MASS, right dorsal foot ganglion  SURGEON:  Eulas Post, MD  PHYSICIAN ASSISTANT: Janace Litten, OPA-C, present and scrubbed throughout the case, critical for completion in a timely fashion, and for retraction, instrumentation, and closure.  ANESTHESIA:   General  PREOPERATIVE INDICATIONS:  Danielle Arellano is a  46 y.o. female with a diagnosis of RIGHT FOOT GANGLION  who failed conservative measures and elected for surgical management.  This interfered with shoe wear, was fluctuating in size, and she elected for surgical excision.  The risks benefits and alternatives were discussed with the patient preoperatively including but not limited to the risks of infection, bleeding, nerve injury, cardiopulmonary complications, the need for revision surgery, among others, and the patient was willing to proceed. We specifically discussed the risks of recurrence, as well as regional pain syndrome, injury to the superficial peroneal nerves, among others.  OPERATIVE IMPLANTS: None  OPERATIVE FINDINGS: There was a region of some hypertrophic fat overlying the cyst, however I don't think that this was truly a lipoma. The cyst itself was identified and was extremely adherent to the fourth and fifth metatarsal-phalangeal joints, and it seemed to be sprouting out of the interspace between the fourth and fifth metatarsal. The cyst itself was approximately 2 x 2 cm. This had normal ganglion appearing fluid with gelatinous contents.  OPERATIVE PROCEDURE: The patient is brought to the operating room and placed in the supine position. General anesthesia was administered. IV antibiotics were given. The right lower extremity was prepped and draped in usual sterile fashion. The leg was elevated and gently exsanguinated and the tourniquet was inflated.  Total tourniquet times approximately 25 minutes. Incision was made through the skin, and care taken to protect the superficial cutaneous branches of the peroneal nerve. I identified the major branch, and retracted this towards the tibial side.  I did excise a fair amount of the fatty accumulation of tissue overlying the ganglion, in order to allow access to the ganglion. Once I found the ganglion, I was able to dissect around this, although I could not achieve circumferential dissection, as the tumor really had a very broad base, confluent with the floor of the dorsal dementia structures overlying the metatarsals.  I dissected the cyst as much as possible, and then excised the cyst completely. I used a house curette to debride down into the joint itself, roughened surfaces, and then excised the cyst he can further deep down within the joint and inter tarsal web space with a rongeur. The cyst was removed in entirety, and but do to the excision, this basically left a relatively wide hole at the roof of the intermetatarsal ligament. This was not able to be sewn back together, because the floor of this essentially was the cyst.  I irrigated the wounds copiously, repaired the skin with nylon suture. The tourniquet was released, sterile gauze followed by a postop shoe was applied. She was awakened and returned to PACU in stable and satisfactory condition.

## 2012-01-22 NOTE — H&P (Signed)
  PREOPERATIVE H&P  Chief Complaint: RIGHT FOOT GANGLION   HPI: Danielle Arellano is a 46 y.o. female who presents for preoperative history and physical with a diagnosis of RIGHT FOOT GANGLION . Symptoms are rated as moderate to severe, and have been worsening.  This is significantly impairing activities of daily living.  She has elected for surgical management.   Past Medical History  Diagnosis Date  . No pertinent past medical history   . Complication of anesthesia     itched post op-not sure what-used nubain  . PONV (postoperative nausea and vomiting)     has motion sickness-used a scop patch  . Carpal tunnel syndrome    Past Surgical History  Procedure Date  . Cesarean section 04,01  . Abdominal hysterectomy 2007    TAH-adhesions  . Diagnostic laparoscopy     mass rt ovary   History   Social History  . Marital Status: Married    Spouse Name: N/A    Number of Children: N/A  . Years of Education: N/A   Social History Main Topics  . Smoking status: Never Smoker   . Smokeless tobacco: None  . Alcohol Use: Yes     rare  . Drug Use: No  . Sexually Active:    Other Topics Concern  . None   Social History Narrative  . None   History reviewed. No pertinent family history. No Known Allergies Prior to Admission medications   Not on File     Positive ROS: All other systems have been reviewed and were otherwise negative with the exception of those mentioned in the HPI and as above.  Physical Exam: General: Alert, no acute distress Cardiovascular: No pedal edema Respiratory: No cyanosis, no use of accessory musculature GI: No organomegaly, abdomen is soft and non-tender Skin: No lesions in the area of chief complaint Neurologic: Sensation intact distally Psychiatric: Patient is competent for consent with normal mood and affect Lymphatic: No axillary or cervical lymphadenopathy  MUSCULOSKELETAL: She has a moderately large mass on the dorsum of her foot  consistent with a ganglion. Sensation is intact distally.  Assessment: RIGHT FOOT GANGLION   Plan: Plan for Procedure(s): EXCISION MASS  The risks benefits and alternatives were discussed with the patient including but not limited to the risks of nonoperative treatment, versus surgical intervention including infection, bleeding, nerve injury,  blood clots, cardiopulmonary complications, morbidity, mortality, among others, and they were willing to proceed.   Kipp Shank P, MD Cell [phone] Pager [phone]  01/22/2012 6:49 AM

## 2012-01-22 NOTE — Anesthesia Postprocedure Evaluation (Signed)
Anesthesia Post Note  Patient: Danielle Arellano  Procedure(s) Performed: Procedure(s) (LRB): EXCISION MASS (Right)  Anesthesia type: General  Patient location: PACU  Post pain: Pain level controlled  Post assessment: Patient's Cardiovascular Status Stable  Last Vitals:  Filed Vitals:   01/22/12 1030  BP: 112/54  Pulse: 70  Temp: 36.7 C  Resp: 18    Post vital signs: Reviewed and stable  Level of consciousness: alert  Complications: No apparent anesthesia complications

## 2012-01-22 NOTE — Anesthesia Procedure Notes (Signed)
Procedure Name: LMA Insertion Performed by: Desera Graffeo W Pre-anesthesia Checklist: Patient identified, Timeout performed, Emergency Drugs available, Suction available and Patient being monitored Patient Re-evaluated:Patient Re-evaluated prior to inductionOxygen Delivery Method: Circle system utilized Preoxygenation: Pre-oxygenation with 100% oxygen Intubation Type: IV induction Ventilation: Mask ventilation without difficulty LMA: LMA inserted LMA Size: 4.0 Number of attempts: 1 Placement Confirmation: breath sounds checked- equal and bilateral and positive ETCO2 Tube secured with: Tape Dental Injury: Teeth and Oropharynx as per pre-operative assessment      

## 2012-01-25 ENCOUNTER — Encounter (HOSPITAL_BASED_OUTPATIENT_CLINIC_OR_DEPARTMENT_OTHER): Payer: Self-pay | Admitting: Orthopedic Surgery

## 2012-06-02 ENCOUNTER — Other Ambulatory Visit: Payer: Self-pay | Admitting: Obstetrics and Gynecology

## 2012-06-02 DIAGNOSIS — Z1231 Encounter for screening mammogram for malignant neoplasm of breast: Secondary | ICD-10-CM

## 2012-07-08 ENCOUNTER — Ambulatory Visit
Admission: RE | Admit: 2012-07-08 | Discharge: 2012-07-08 | Disposition: A | Discharge status: Home / Self Care | Payer: BC Managed Care – PPO | Source: Ambulatory Visit | Attending: Obstetrics and Gynecology | Admitting: Obstetrics and Gynecology

## 2012-07-08 DIAGNOSIS — Z1231 Encounter for screening mammogram for malignant neoplasm of breast: Secondary | ICD-10-CM

## 2012-07-14 ENCOUNTER — Other Ambulatory Visit: Payer: Self-pay | Admitting: Obstetrics and Gynecology

## 2012-07-14 DIAGNOSIS — E010 Iodine-deficiency related diffuse (endemic) goiter: Secondary | ICD-10-CM

## 2012-07-15 ENCOUNTER — Other Ambulatory Visit: Payer: BC Managed Care – PPO

## 2012-07-18 ENCOUNTER — Ambulatory Visit
Admission: RE | Admit: 2012-07-18 | Discharge: 2012-07-18 | Disposition: A | Discharge status: Home / Self Care | Payer: BC Managed Care – PPO | Source: Ambulatory Visit | Attending: Obstetrics and Gynecology | Admitting: Obstetrics and Gynecology

## 2012-07-18 DIAGNOSIS — E010 Iodine-deficiency related diffuse (endemic) goiter: Secondary | ICD-10-CM

## 2013-06-13 ENCOUNTER — Other Ambulatory Visit: Payer: Self-pay

## 2013-06-13 DIAGNOSIS — Z1231 Encounter for screening mammogram for malignant neoplasm of breast: Secondary | ICD-10-CM

## 2013-07-06 ENCOUNTER — Encounter: Payer: Self-pay | Admitting: Obstetrics and Gynecology

## 2013-07-14 ENCOUNTER — Ambulatory Visit (INDEPENDENT_AMBULATORY_CARE_PROVIDER_SITE_OTHER): Payer: BC Managed Care – PPO | Admitting: Obstetrics and Gynecology

## 2013-07-14 ENCOUNTER — Ambulatory Visit
Admission: RE | Admit: 2013-07-14 | Discharge: 2013-07-14 | Disposition: A | Discharge status: Home / Self Care | Payer: BC Managed Care – PPO | Source: Ambulatory Visit

## 2013-07-14 ENCOUNTER — Encounter: Payer: Self-pay | Admitting: Obstetrics and Gynecology

## 2013-07-14 ENCOUNTER — Ambulatory Visit: Payer: Self-pay | Admitting: Obstetrics and Gynecology

## 2013-07-14 VITALS — BP 143/67 | HR 91 | Resp 18 | Ht 63.75 in | Wt 176.0 lb

## 2013-07-14 DIAGNOSIS — Z1231 Encounter for screening mammogram for malignant neoplasm of breast: Secondary | ICD-10-CM

## 2013-07-14 DIAGNOSIS — N951 Menopausal and female climacteric states: Secondary | ICD-10-CM

## 2013-07-14 DIAGNOSIS — Z0000 Encounter for general adult medical examination without abnormal findings: Secondary | ICD-10-CM

## 2013-07-14 DIAGNOSIS — Z01419 Encounter for gynecological examination (general) (routine) without abnormal findings: Secondary | ICD-10-CM

## 2013-07-14 LAB — COMPREHENSIVE METABOLIC PANEL
ALBUMIN: 4 g/dL (ref 3.5–5.2)
ALK PHOS: 87 U/L (ref 39–117)
ALT: 39 U/L — ABNORMAL HIGH (ref 0–35)
AST: 30 U/L (ref 0–37)
BUN: 13 mg/dL (ref 6–23)
CO2: 29 mEq/L (ref 19–32)
CREATININE: 0.76 mg/dL (ref 0.50–1.10)
Calcium: 9.7 mg/dL (ref 8.4–10.5)
Chloride: 102 mEq/L (ref 96–112)
GLUCOSE: 103 mg/dL — AB (ref 70–99)
POTASSIUM: 4.1 meq/L (ref 3.5–5.3)
Sodium: 142 mEq/L (ref 135–145)
Total Bilirubin: 0.3 mg/dL (ref 0.2–1.2)
Total Protein: 7.2 g/dL (ref 6.0–8.3)

## 2013-07-14 LAB — LIPID PANEL
CHOL/HDL RATIO: 3.5 ratio
CHOLESTEROL: 150 mg/dL (ref 0–200)
HDL: 43 mg/dL (ref >39)
LDL CALC: 80 mg/dL (ref 0–99)
TRIGLYCERIDES: 136 mg/dL (ref <150)
VLDL: 27 mg/dL (ref 0–40)

## 2013-07-14 LAB — POCT URINALYSIS DIPSTICK
Bilirubin, UA: NEGATIVE
Glucose, UA: NEGATIVE
KETONES UA: NEGATIVE
Leukocytes, UA: NEGATIVE
Nitrite, UA: NEGATIVE
PH UA: 6
PROTEIN UA: NEGATIVE
RBC UA: NEGATIVE
UROBILINOGEN UA: NEGATIVE

## 2013-07-14 LAB — CBC
HEMATOCRIT: 39.7 % (ref 36.0–46.0)
HEMOGLOBIN: 13.2 g/dL (ref 12.0–15.0)
MCH: 28.3 pg (ref 26.0–34.0)
MCHC: 33.2 g/dL (ref 30.0–36.0)
MCV: 85 fL (ref 78.0–100.0)
Platelets: 281 10*3/uL (ref 150–400)
RBC: 4.67 MIL/uL (ref 3.87–5.11)
RDW: 15.7 % — ABNORMAL HIGH (ref 11.5–15.5)
WBC: 4.5 10*3/uL (ref 4.0–10.5)

## 2013-07-14 LAB — HEMOGLOBIN, FINGERSTICK: Hemoglobin, fingerstick: 13.4 g/dL (ref 12.0–16.0)

## 2013-07-14 LAB — TSH: TSH: 1.631 u[IU]/mL (ref 0.350–4.500)

## 2013-07-14 NOTE — Progress Notes (Signed)
GYNECOLOGY VISIT  PCP: Sigmund HazelLisa Miller, MD  Referring provider:   HPI: 48 y.o.   Married  [place]   G2P2 with No LMP recorded. Patient has had a hysterectomy.  here for   Annual Exam.  Notes heaviness of the right lateral breast. No palpable lump.  Wearing new bras.  Has gained 10 - 15 pounds over the last year.   Having night sweats. [place] twice at night to use bathroom.  Doing smoothies with flax seed to help.  No hot flashes during the day.  Wants to know where she is in the spectrum of menopause.   Saw sleep specialist a couple of weeks ago and may have sleep apnea, but testing is not complete yet.   Had normal thyroid ultrasound in 2014 for clinically enlarged thyroid.   Not sure if leaking urine.  Notices some moisture in underwear at the end of the day.  Drinks a lot of tea.  Hgb: 13.4  Urine: neg    GYNECOLOGIC HISTORY: No LMP recorded. Patient has had a hysterectomy. Sexually active:  yes Partner preference: female Contraception:   TAH, ovaries retained.  Menopausal hormone therapy: no DES exposure:   no Blood transfusions:   no Sexually transmitted diseases:  no GYN Procedures:  TAH for fibroids 2002, Laparoscopic LSO and LOA for an inclusion cyst.  Mammogram:      07/08/12 benign, [facility]13  [facility]     3-D Pap:   05/11/08 neg   Testing for HRHPV was not done History of abnormal pap smear:  no     OB History   Grav Para Term Preterm Abortions TAB SAB Ect Mult Living   2 2        2        LIFESTYLE: Exercise:    no           Tobacco: no Alcohol: rarely Drug use:  no  OTHER HEALTH MAINTENANCE: Tetanus/TDap: 05/11/2008 Gardisil: no Influenza:  no Zostavax: no  Bone density: never Colonoscopy: never  Cholesterol check: 06/18/10 normal  Family History  Problem Relation Age of Onset  . Epilepsy Father   . Stroke Father   . Arthritis Mother     Patient Active Problem List   Diagnosis Date Noted  . Foot mass, right ganglion 01/22/2012    Past Medical History  Diagnosis Date  . No pertinent past medical history   . Carpal tunnel syndrome   . Complication of anesthesia     itched post op-not sure what-used nubain  . PONV (postoperative nausea and vomiting)     has motion sickness-used a scop patch  . Foot mass, right ganglion 01/22/2012  . Leukopenia 4/95    benign- annual CBC  . Fibroid 05/08/06    TAH retained OV  . Thyromegaly     Past Surgical History  Procedure Laterality Date  . Cesarean section  04,01  . Diagnostic laparoscopy      mass rt ovary  . Mass excision  01/22/2012    Procedure: EXCISION MASS;  Surgeon: Eulas PostJoshua P Landau, MD;  Location: [facility];  Service: Orthopedics;  Laterality: Right;  Excision mass right dorsal foot  . Total abdominal hysterectomy  2007    TAH-adhesions  . Myomectomies  10/99    Multiple    ALLERGIES: Review of patient's allergies indicates no known allergies.  Current Outpatient Prescriptions  Medication Sig Dispense Refill  . Multiple Vitamin (MULTI-VITAMINS PO) Take by mouth daily.      .Marland Kitchen  Vitamin D, Ergocalciferol, (DRISDOL) 50000 UNITS CAPS capsule Take 50,000 Units by mouth every 14 (fourteen) days.       No current facility-administered medications for this visit.     ROS:  Pertinent items are noted in HPI.  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   PHYSICAL EXAMINATION:    BP 143/67  Pulse 91  Resp 18  Ht 5' 3.75" (1.619 m)  Wt 176 lb (79.833 kg)  BMI 30.46 kg/m2   Wt Readings from Last 3 Encounters:  07/14/13 176 lb (79.833 kg)  01/22/12 168 lb 6 oz (76.374 kg)  01/22/12 168 lb 6 oz (76.374 kg)     Ht Readings from Last 3 Encounters:  07/14/13 5' 3.75" (1.619 m)  01/19/12 5\' 4"  (1.626 m)  01/19/12 5\' 4"  (1.626 m)    General appearance: alert, cooperative and appears stated age Head: Normocephalic, without obvious abnormality, atraumatic Neck: no adenopathy, supple, symmetrical, trachea midline and thyroid not enlarged,  symmetric, no tenderness/mass/nodules Lungs: clear to auscultation bilaterally Breasts: Inspection negative, No nipple retraction or dimpling, No nipple discharge or bleeding, No axillary or supraclavicular adenopathy, Normal to palpation without dominant masses Heart: regular rate and rhythm Abdomen: vertical midline incision, soft, non-tender; no masses,  no organomegaly Extremities: extremities normal, atraumatic, no cyanosis or edema Skin: Skin color, texture, turgor normal. No rashes or lesions Lymph nodes: Cervical, supraclavicular, and axillary nodes normal. No abnormal inguinal nodes palpated Neurologic: Grossly normal  Pelvic: External genitalia:  no lesions              Urethra:  normal appearing urethra with no masses, tenderness or lesions              Bartholins and Skenes: normal                 Vagina: normal appearing vagina with normal color and discharge, no lesions              Cervix: absent.              Pap and high risk HPV testing done: no.            Bimanual Exam:  Uterus:   Absent.                                       Adnexa: normal adnexa in size, nontender and no masses                                      Rectovaginal: Confirms                                      Anus:  normal sphincter tone, no lesions  ASSESSMENT  Normal gynecologic exam. Status post TAH/LSO. Mastalgia.  I believe that this may be due to significant weight gain.  Today's mammogram results pending at the time of visit here today.  Menopause symptoms.   PLAN  Mammogram yearly.  Pap smear and high risk HPV testing not indicated.  FLP, CBC, CMP, TSH, FSH, estradiol. Encouraged healthy diet and exercise.  I discussed menopause signs and symptoms and possible treatment with estrogen therapy versus SSRIs.  Patient will consider. I also gave patient comprehensive written material  about menopause for further self study.    Return annually or prn   An After Visit Summary was printed and  given to the patient.

## 2013-07-14 NOTE — Patient Instructions (Signed)

## 2013-07-15 LAB — FOLLICLE STIMULATING HORMONE: FSH: 37.7 m[IU]/mL

## 2013-07-15 LAB — ESTRADIOL: Estradiol: 21.9 pg/mL

## 2013-07-16 ENCOUNTER — Other Ambulatory Visit: Payer: Self-pay | Admitting: Obstetrics and Gynecology

## 2013-07-16 DIAGNOSIS — R945 Abnormal results of liver function studies: Secondary | ICD-10-CM

## 2013-07-18 ENCOUNTER — Telehealth: Payer: Self-pay | Admitting: *Deleted

## 2013-07-18 ENCOUNTER — Other Ambulatory Visit: Payer: Self-pay | Admitting: Obstetrics and Gynecology

## 2013-07-18 DIAGNOSIS — E559 Vitamin D deficiency, unspecified: Secondary | ICD-10-CM

## 2013-07-18 NOTE — Telephone Encounter (Signed)
Message copied by Joselyn Arrow on Tue Jul 18, 2013 10:37 AM ------      Message from: [place]      Created: Sun Jul 16, 2013  6:17 PM       Please report results to patient            [facility] looks like it is going up and estradiol is going down, but patient is not likely fully in menopause yet!            Cholesterol good.      Thyroid normal.      Glucose a couple of points above normal - OK for a random glucose.       ALT (liver enzyme) also a couple of points above normal.  - do recommend retesting this in one month.  I will put an order in for this. ------

## 2013-08-23 ENCOUNTER — Other Ambulatory Visit (INDEPENDENT_AMBULATORY_CARE_PROVIDER_SITE_OTHER): Payer: BC Managed Care – PPO

## 2013-08-23 DIAGNOSIS — R945 Abnormal results of liver function studies: Secondary | ICD-10-CM

## 2013-08-23 DIAGNOSIS — E559 Vitamin D deficiency, unspecified: Secondary | ICD-10-CM

## 2013-08-24 LAB — AST: AST: 33 U/L (ref 0–37)

## 2013-08-24 LAB — VITAMIN D 25 HYDROXY (VIT D DEFICIENCY, FRACTURES): Vit D, 25-Hydroxy: 53 ng/mL (ref 30–89)

## 2013-08-24 LAB — ALT: ALT: 32 U/L (ref 0–35)

## 2014-03-30 ENCOUNTER — Telehealth: Payer: Self-pay | Admitting: Obstetrics and Gynecology

## 2014-03-30 NOTE — Telephone Encounter (Signed)
LMTCB to reschedule patient's AEX with Dr. Quincy Simmonds for 07/2014.

## 2014-04-09 ENCOUNTER — Encounter: Payer: Self-pay | Admitting: Obstetrics and Gynecology

## 2014-06-08 DIAGNOSIS — N3289 Other specified disorders of bladder: Secondary | ICD-10-CM

## 2014-06-08 HISTORY — DX: Other specified disorders of bladder: N32.89

## 2014-06-21 ENCOUNTER — Other Ambulatory Visit: Payer: Self-pay

## 2014-06-21 DIAGNOSIS — Z1231 Encounter for screening mammogram for malignant neoplasm of breast: Secondary | ICD-10-CM

## 2014-07-20 ENCOUNTER — Ambulatory Visit: Payer: BC Managed Care – PPO | Admitting: Obstetrics and Gynecology

## 2014-07-31 ENCOUNTER — Other Ambulatory Visit: Payer: Self-pay | Admitting: Dermatology

## 2014-07-31 ENCOUNTER — Ambulatory Visit
Admission: RE | Admit: 2014-07-31 | Discharge: 2014-07-31 | Disposition: A | Discharge status: Home / Self Care | Payer: BC Managed Care – PPO | Source: Ambulatory Visit

## 2014-07-31 DIAGNOSIS — Z1231 Encounter for screening mammogram for malignant neoplasm of breast: Secondary | ICD-10-CM

## 2014-08-27 ENCOUNTER — Ambulatory Visit: Payer: BC Managed Care – PPO | Admitting: Obstetrics and Gynecology

## 2014-09-26 ENCOUNTER — Encounter: Payer: Self-pay | Admitting: Obstetrics and Gynecology

## 2014-09-26 ENCOUNTER — Ambulatory Visit (INDEPENDENT_AMBULATORY_CARE_PROVIDER_SITE_OTHER): Payer: BC Managed Care – PPO | Admitting: Obstetrics and Gynecology

## 2014-09-26 VITALS — BP 104/70 | HR 76 | Resp 16 | Ht 64.0 in | Wt 167.2 lb

## 2014-09-26 DIAGNOSIS — Z0000 Encounter for general adult medical examination without abnormal findings: Secondary | ICD-10-CM | POA: Diagnosis not present

## 2014-09-26 DIAGNOSIS — Z1211 Encounter for screening for malignant neoplasm of colon: Secondary | ICD-10-CM

## 2014-09-26 DIAGNOSIS — Z01419 Encounter for gynecological examination (general) (routine) without abnormal findings: Secondary | ICD-10-CM

## 2014-09-26 LAB — CBC
HCT: 38.9 % (ref 36.0–46.0)
HEMOGLOBIN: 12.7 g/dL (ref 12.0–15.0)
MCH: 27.9 pg (ref 26.0–34.0)
MCHC: 32.6 g/dL (ref 30.0–36.0)
MCV: 85.5 fL (ref 78.0–100.0)
MPV: 10.7 fL (ref 8.6–12.4)
PLATELETS: 276 10*3/uL (ref 150–400)
RBC: 4.55 MIL/uL (ref 3.87–5.11)
RDW: 15.2 % (ref 11.5–15.5)
WBC: 4.1 10*3/uL (ref 4.0–10.5)

## 2014-09-26 LAB — POCT URINALYSIS DIPSTICK
BILIRUBIN UA: NEGATIVE
GLUCOSE UA: NEGATIVE
Ketones, UA: NEGATIVE
Leukocytes, UA: NEGATIVE
Nitrite, UA: NEGATIVE
PH UA: 5
Protein, UA: NEGATIVE
RBC UA: NEGATIVE
UROBILINOGEN UA: NEGATIVE

## 2014-09-26 LAB — COMPREHENSIVE METABOLIC PANEL
ALK PHOS: 69 U/L (ref 39–117)
ALT: 17 U/L (ref 0–35)
AST: 18 U/L (ref 0–37)
Albumin: 4 g/dL (ref 3.5–5.2)
BILIRUBIN TOTAL: 0.4 mg/dL (ref 0.2–1.2)
BUN: 13 mg/dL (ref 6–23)
CO2: 26 mEq/L (ref 19–32)
Calcium: 9.5 mg/dL (ref 8.4–10.5)
Chloride: 105 mEq/L (ref 96–112)
Creat: 0.8 mg/dL (ref 0.50–1.10)
Glucose, Bld: 76 mg/dL (ref 70–99)
Potassium: 4.8 mEq/L (ref 3.5–5.3)
Sodium: 140 mEq/L (ref 135–145)
TOTAL PROTEIN: 7.1 g/dL (ref 6.0–8.3)

## 2014-09-26 LAB — THYROID PANEL WITH TSH
FREE THYROXINE INDEX: 2.7 (ref 1.4–3.8)
T3 Uptake: 29 % (ref 22–35)
T4, Total: 9.3 ug/dL (ref 4.5–12.0)
TSH: 1.634 u[IU]/mL (ref 0.350–4.500)

## 2014-09-26 LAB — LIPID PANEL
CHOL/HDL RATIO: 2.9 ratio
CHOLESTEROL: 132 mg/dL (ref 0–200)
HDL: 45 mg/dL — ABNORMAL LOW (ref >=46)
LDL Cholesterol: 69 mg/dL (ref 0–99)
TRIGLYCERIDES: 90 mg/dL (ref <150)
VLDL: 18 mg/dL (ref 0–40)

## 2014-09-26 NOTE — Progress Notes (Signed)
Patient ID: Park Meo, female   DOB: Oct 05, 1965, 49 y.o.   MRN: [id number] 49 y.o. G2P2 MarriedAfrican AmericanF here for annual exam.    Having breast discomfort at the end of the day.  Both breasts feel inflamed. Normal mammogram.   Wants labs today.   Ding clean eating. Hot flashes have almost resolved.  Some night sweats but they are minimal.   Had left salpingo-oophorectomy 3 -4 years ago with Dr. Brien Few for scar tissue.  Right ovary remained due to more significant scar tissue on that side.   Kids busy with activities.  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   PCP:  Kathyrn Lass, MD  No LMP recorded. Patient has had a hysterectomy.          Sexually active: Yes.   female partner The current method of family planning is status post hysterectomy.   Status post TAH for fibroids.  Ovaries remain.  Exercising: Yes.    moderate walking. Smoker:  no  Health Maintenance: Pap: 05-11-08 wnl  History of abnormal Pap:  no MMG:  07-31-14 dense/nl:[facility] Colonoscopy:  NEVER BMD:   n/a TDaP:  05-11-08 Screening Labs:   Hb today: 12.3, Urine today: Neg   reports that she has never smoked. She has never used smokeless tobacco. She reports that she drinks alcohol. She reports that she does not use illicit drugs.  Past Medical History  Diagnosis Date  . No pertinent past medical history   . Carpal tunnel syndrome   . Complication of anesthesia     itched post op-not sure what-used nubain  . PONV (postoperative nausea and vomiting)     has motion sickness-used a scop patch  . Foot mass, right ganglion 01/22/2012  . Leukopenia 4/95    benign- annual CBC  . Fibroid 05/08/06    TAH retained OV  . Thyromegaly     Past Surgical History  Procedure Laterality Date  . Cesarean section  04,01  . Diagnostic laparoscopy      mass rt ovary  . Mass excision  01/22/2012    Procedure: EXCISION MASS;  Surgeon: Johnny Bridge, MD;  Location: [place];   Service: Orthopedics;  Laterality: Right;  Excision mass right dorsal foot  . Total abdominal hysterectomy  2007    TAH-adhesions  . Myomectomies  10/99    Multiple    Current Outpatient Prescriptions  Medication Sig Dispense Refill  . clobetasol cream (TEMOVATE) 6.55 % Apply 1 application topically as needed.    . Multiple Vitamin (MULTI-VITAMINS PO) Take by mouth daily.     No current facility-administered medications for this visit.    Family History  Problem Relation Age of Onset  . Epilepsy Father   . Stroke Father   . Arthritis Mother     ROS:  Pertinent items are noted in HPI.  Otherwise, a comprehensive ROS was negative.  Exam:   BP 104/70 mmHg  Pulse 76  Resp 16  Ht 5\' 4"  (1.626 m)  Wt 167 lb 3.2 oz (75.841 kg)  BMI 28.69 kg/m2      Height: 5\' 4"  (162.6 cm)  Ht Readings from Last 3 Encounters:  09/26/14 5\' 4"  (1.626 m)  07/14/13 5' 3.75" (1.619 m)  01/19/12 5\' 4"  (1.626 m)    General appearance: alert, cooperative and appears stated age Head: Normocephalic, without obvious abnormality, atraumatic Neck: no adenopathy, supple, symmetrical, trachea midline and thyroid normal to inspection and palpation  Lungs: clear to auscultation bilaterally Breasts: normal appearance, no masses or tenderness, Inspection negative, No nipple retraction or dimpling, No nipple discharge or bleeding, No axillary or supraclavicular adenopathy Heart: regular rate and rhythm Abdomen: vertical midline incision, soft, non-tender; bowel sounds normal; no masses,  no organomegaly Extremities: extremities normal, atraumatic, no cyanosis or edema Skin: Skin color, texture, turgor normal. No rashes or lesions Lymph nodes: Cervical, supraclavicular, and axillary nodes normal. No abnormal inguinal nodes palpated Neurologic: Grossly normal   Pelvic: External genitalia:  no lesions              Urethra:  normal appearing urethra with no masses, tenderness or lesions              Bartholins  and Skenes: normal                 Vagina: normal appearing vagina with normal color and discharge, no lesions              Cervix: absent              Pap taken: No. Bimanual Exam:  Uterus:  uterus absent              Adnexa: normal adnexa and no mass, fullness, tenderness               Rectovaginal: Confirms               Anus:  normal sphincter tone, no lesions  Chaperone was present for exam.  A:  Well Woman with normal exam Breast discomfort in general. Normal mammogram.  Status post TAH and LSO.  Hx thyromegaly.   P:   Mammogram yearly.  Do self breast exam.  Vit E 400 IU daily, periodic ibuprofen, new bra for breast comfort.  pap smear not indicated.  Routine labs, thyroid function studies.  Referral for colonoscopy.  Discussed calcium, vit D, and exercise.  return annually or prn

## 2014-09-26 NOTE — Patient Instructions (Signed)

## 2014-09-27 LAB — HEMOGLOBIN, FINGERSTICK: HEMOGLOBIN, FINGERSTICK: 12.3 g/dL (ref 12.0–16.0)

## 2014-10-05 ENCOUNTER — Telehealth: Payer: Self-pay | Admitting: Obstetrics and Gynecology

## 2014-10-05 NOTE — Telephone Encounter (Signed)
Left message for patient to call back. Need to advise of appointment with Dr Collene Mares on May 10 @ 2pm.

## 2014-11-19 ENCOUNTER — Ambulatory Visit: Payer: BC Managed Care – PPO | Admitting: Nurse Practitioner

## 2014-11-23 ENCOUNTER — Telehealth: Payer: Self-pay | Admitting: Obstetrics and Gynecology

## 2014-11-23 ENCOUNTER — Ambulatory Visit (INDEPENDENT_AMBULATORY_CARE_PROVIDER_SITE_OTHER): Payer: BC Managed Care – PPO | Admitting: Obstetrics & Gynecology

## 2014-11-23 VITALS — BP 126/68 | HR 88 | Ht 64.0 in | Wt 166.2 lb

## 2014-11-23 DIAGNOSIS — N644 Mastodynia: Secondary | ICD-10-CM | POA: Diagnosis not present

## 2014-11-23 DIAGNOSIS — D181 Lymphangioma, any site: Secondary | ICD-10-CM

## 2014-11-23 NOTE — Progress Notes (Signed)
Subjective:     Patient ID: Danielle Arellano, female   DOB: 1965/09/29, 49 y.o.   MRN: [id number]  HPI 49 yo G2P2 MAAF here for complaint of right breast pain/discomfort.  Feels more like she has achiness in her right breast.  Also, feeling two nodule on edge of right breast.  No recent breast trauma.  No nipple discharge or blood.    Reports she has a breast rash--lymphangioma.  Has seen Dr. Jon Gills this year regarding this.  Not sure if this is related.  Has a "cream" for this to lighten it but not really helping.  Not sure if this is contributing or not to the pain.    Last MMG was 3D and done 07/31/14.  reviewed with pt in office.  Review of Systems  All other systems reviewed and are negative.      Objective:   Physical Exam  Constitutional: She is oriented to person, place, and time. She appears well-developed and well-nourished.  Pulmonary/Chest: Right breast exhibits no inverted nipple, no mass, no nipple discharge, no skin change and no tenderness. Left breast exhibits no inverted nipple, no mass, no nipple discharge, no skin change and no tenderness. Breasts are symmetrical.    Neurological: She is alert and oriented to person, place, and time.  Skin: Skin is warm and dry.  Psychiatric: She has a normal mood and affect.       Assessment:     Breast pain, completely normal exam today with neg 3D MMG 2/16 Recent diagnosis of lymphangioma of skin that pt would like additional recommendations regarding treatement    Plan:     With neg 3D MMG in Feb and neg exam today, feel ok to watch.  Will plan recheck one month and if still bothering her, will considering imaging.  Caffeine and Vit e discussed.  Will see if I can find out some more treatment options for her vascular skin changes and let her know.

## 2014-11-23 NOTE — Telephone Encounter (Signed)
Patient walked into clinic today to be seen for right breast pain. Patient states she has been experiencing right breast pain since since April. States that pain is persisting/worsening and she is concerned. Patient denies any swelling, redness, or lumps in breast. Last mammogram was performed on 07/31/2014. Patient would like to be seen today for further evaluation. Appointment scheduled for today at 2:30pm with Dr.Miller. Patient is agreeable.  Routing to provider for final review. Patient agreeable to disposition. Will close encounter.

## 2014-11-23 NOTE — Telephone Encounter (Signed)
Patient is having right breast tenderness under her arm. Chart to triage.

## 2014-12-07 ENCOUNTER — Encounter: Payer: Self-pay | Admitting: Obstetrics & Gynecology

## 2014-12-07 DIAGNOSIS — D181 Lymphangioma, any site: Secondary | ICD-10-CM | POA: Insufficient documentation

## 2014-12-11 ENCOUNTER — Telehealth: Payer: Self-pay | Admitting: Emergency Medicine

## 2014-12-11 DIAGNOSIS — D181 Lymphangioma, any site: Secondary | ICD-10-CM

## 2014-12-11 NOTE — Telephone Encounter (Signed)
Call to patient and message from Dr. Sabra Heck given. Patient agreeable to referral.  [facility], appointment scheduled with Dr. Marcine Matar for 02/12/15 at 1040 arrival 1020.   Call to patient she is agreeable.  Routing to provider for final review. Patient agreeable to disposition. Will close encounter.

## 2014-12-11 NOTE — Telephone Encounter (Signed)
-----   Message from Megan Salon, MD sent at 12/07/2014  7:17 AM EDT ----- Regarding: question pt asked me when here last week Danielle Arellano, Pt asked me about a diagnosis she has of lymphangioma of skin.  Has seen Dr. Jon Gills and he gave her a steroid cream to lighten it.  She wanted other suggestions for treatment and I don't know any but I did ask a plastic surgeon friend and she recommended pt go for a second opinion as she feels if the diagnosis is correct, there should be other treatment options.  Can you let pt know and then see if she wants [place] to do referral.  I would send her to [facility], one of the females if possible.  Don't know how long out that would be.  Thanks.

## 2015-01-14 LAB — HM COLONOSCOPY

## 2015-01-30 ENCOUNTER — Telehealth: Payer: Self-pay | Admitting: Obstetrics and Gynecology

## 2015-01-30 NOTE — Telephone Encounter (Signed)
Spoke with patient. Patient states that for 3-4 weeks she has been experiencing a "pressure/ache on her left side." "It feels like I am going to start my period, but I have had a hysterectomy. I do still have my ovaries and feel it may be that." Denies any sharp pains, nausea, vomiting, or bloating. Was recently seen with GI for a colonoscopy and states everything was normal. Requesting to come in to see Dr.Silva on 02/14/2015. Appointment scheduled for 02/14/2015 at 2:30pm with Dr.Silva. Advised patient if symptom worsens or develops new symptoms will need to be seen earlier for evaluation. Patient is agreeable.  Routing to provider for final review. Patient agreeable to disposition. Will close encounter.   Patient aware provider will review message and nurse will return call if any additional advice or change of disposition.

## 2015-01-30 NOTE — Telephone Encounter (Signed)
Patient has a dull ache on her left side and thinks it may be her ovary.

## 2015-02-14 ENCOUNTER — Other Ambulatory Visit: Payer: Self-pay | Admitting: Obstetrics and Gynecology

## 2015-02-14 ENCOUNTER — Ambulatory Visit (INDEPENDENT_AMBULATORY_CARE_PROVIDER_SITE_OTHER): Payer: BC Managed Care – PPO | Admitting: Obstetrics and Gynecology

## 2015-02-14 ENCOUNTER — Encounter: Payer: Self-pay | Admitting: Obstetrics and Gynecology

## 2015-02-14 VITALS — BP 110/52 | HR 96 | Temp 98.2°F | Resp 16 | Ht 64.0 in | Wt 171.0 lb

## 2015-02-14 DIAGNOSIS — R8290 Unspecified abnormal findings in urine: Secondary | ICD-10-CM | POA: Diagnosis not present

## 2015-02-14 DIAGNOSIS — R1032 Left lower quadrant pain: Secondary | ICD-10-CM

## 2015-02-14 DIAGNOSIS — R5383 Other fatigue: Secondary | ICD-10-CM

## 2015-02-14 DIAGNOSIS — R5381 Other malaise: Secondary | ICD-10-CM | POA: Diagnosis not present

## 2015-02-14 DIAGNOSIS — R7309 Other abnormal glucose: Secondary | ICD-10-CM | POA: Insufficient documentation

## 2015-02-14 DIAGNOSIS — Z0000 Encounter for general adult medical examination without abnormal findings: Secondary | ICD-10-CM | POA: Insufficient documentation

## 2015-02-14 HISTORY — DX: Other abnormal glucose: R73.09

## 2015-02-14 LAB — CBC
HCT: 37.5 % (ref 36.0–46.0)
Hemoglobin: 12.4 g/dL (ref 12.0–15.0)
MCH: 28.1 pg (ref 26.0–34.0)
MCHC: 33.1 g/dL (ref 30.0–36.0)
MCV: 84.8 fL (ref 78.0–100.0)
MPV: 10.1 fL (ref 8.6–12.4)
PLATELETS: 247 10*3/uL (ref 150–400)
RBC: 4.42 MIL/uL (ref 3.87–5.11)
RDW: 15 % (ref 11.5–15.5)
WBC: 3.6 10*3/uL — AB (ref 4.0–10.5)

## 2015-02-14 LAB — POCT URINALYSIS DIPSTICK
Bilirubin, UA: NEGATIVE
Glucose, UA: NEGATIVE
Ketones, UA: NEGATIVE
Nitrite, UA: NEGATIVE
PROTEIN UA: NEGATIVE
Urobilinogen, UA: NEGATIVE
pH, UA: 5

## 2015-02-14 LAB — COMPREHENSIVE METABOLIC PANEL
ALT: 23 U/L (ref 6–29)
AST: 25 U/L (ref 10–35)
Albumin: 4.2 g/dL (ref 3.6–5.1)
Alkaline Phosphatase: 80 U/L (ref 33–130)
BUN: 14 mg/dL (ref 7–25)
CHLORIDE: 105 mmol/L (ref 98–110)
CO2: 30 mmol/L (ref 20–31)
Calcium: 9.5 mg/dL (ref 8.6–10.4)
Creat: 0.75 mg/dL (ref 0.50–1.05)
GLUCOSE: 104 mg/dL — AB (ref 65–99)
Potassium: 4.1 mmol/L (ref 3.5–5.3)
SODIUM: 141 mmol/L (ref 135–146)
Total Bilirubin: 0.3 mg/dL (ref 0.2–1.2)
Total Protein: 7.1 g/dL (ref 6.1–8.1)

## 2015-02-14 LAB — TSH: TSH: 1.55 u[IU]/mL (ref 0.350–4.500)

## 2015-02-14 NOTE — Progress Notes (Signed)
GYNECOLOGY  VISIT   HPI: 49 y.o.   Married  [place] American  female   G60P2 with Patient's last menstrual period was 06/08/2005 (approximate).   here for Abdominal pain     LLQ pain radiating to the umbilical region.  Feels a dull ache for a couple of months.  Nothing makes it better or worse.  Pain is not strong enough to miss work or wake her up.   Also has decreased energy.  Hx low vit D.  Difficulty getting out of bed due to low energy.   No nausea or vomiting.  No fevers.  No dysuria.  No diarrhea.  No blood in stool or black tarry stools.  BMs twice a day.  Doing clean eating.   Colonoscopy July 2016 - normal.   Weight gain.   Some hot flashes once a night.   Status post TAH and multiple myomectomies.  Had left salpingo-oophorectomy 3 -4 years ago with Dr. Brien Few for scar tissue. Right ovary remained due to more significant scar tissue on that side.   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   UA - trace WBCs and trace RBCs.  GYNECOLOGIC HISTORY: Patient's last menstrual period was 06/08/2005 (approximate). Contraception: Hysterectomy Menopausal hormone therapy: None Last mammogram: 07/31/14 BIRADS1:neg Last pap smear: 05/2008 Normal         OB History    Gravida Para Term Preterm AB TAB SAB Ectopic Multiple Living   2 2        2          Patient Active Problem List   Diagnosis Date Noted  . 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 Foot mass, right ganglion 01/22/2012    Past Medical History  Diagnosis Date  . No pertinent past medical history   . Carpal tunnel syndrome   . Complication of anesthesia     itched post op-not sure what-used nubain  . PONV (postoperative nausea and vomiting)     has motion sickness-used a scop patch  . Foot mass, right ganglion 01/22/2012  . Leukopenia 4/95    benign- annual CBC  . Fibroid 05/08/06    TAH retained OV  . Thyromegaly     Past Surgical History  Procedure Laterality Date  . Cesarean  section  04,01  . Diagnostic laparoscopy      mass rt ovary  . Mass excision  01/22/2012    Procedure: EXCISION MASS;  Surgeon: Johnny Bridge, MD;  Location: [place];  Service: Orthopedics;  Laterality: Right;  Excision mass right dorsal foot  . Total abdominal hysterectomy  2007    TAH-adhesions  . Myomectomies  10/99    Multiple    Current Outpatient Prescriptions  Medication Sig Dispense Refill  . Multiple Vitamin (MULTI-VITAMINS PO) Take by mouth daily.    . Omega-3 Fatty Acids (FISH OIL) 1000 MG CAPS Take by mouth daily.     No current facility-administered medications for this visit.     ALLERGIES: Review of patient's allergies indicates no known allergies.  Family History  Problem Relation Age of Onset  . Epilepsy Father   . Stroke Father   . Arthritis Mother     Social History   Social History  . Marital Status: Married    Spouse Name: N/A  . Number of Children: N/A  . Years of Education: N/A   Occupational History  . Not on file.   Social History Main Topics  . Smoking status: Never Smoker   .  Smokeless tobacco: Never Used  . Alcohol Use: 0.0 oz/week    0 Standard drinks or equivalent per week     Comment: rarely  . Drug Use: No  . Sexual Activity:    Partners: Male    Birth Control/ Protection: Surgical     Comment: TAH   Other Topics Concern  . Not on file   Social History Narrative    ROS:  Pertinent items are noted in HPI.  PHYSICAL EXAMINATION:    BP 110/52 mmHg  Pulse 96  Temp(Src) 98.2 F (36.8 C) (Oral)  Resp 16  Ht 5\' 4"  (1.626 m)  Wt 171 lb (77.565 kg)  BMI 29.34 kg/m2  LMP 06/08/2005 (Approximate)    General appearance: alert, cooperative and appears stated age  Pelvic: External genitalia:  no lesions              Urethra:  normal appearing urethra with no masses, tenderness or lesions              Bartholins and Skenes: normal                 Vagina: normal appearing vagina with normal color and  discharge, no lesions              Cervix: absent              Bimanual Exam:  Uterus:  uterus absent              Adnexa: no mass, fullness, tenderness              Rectovaginal: Yes.  .  Confirms.              Anus:  normal sphincter tone, no lesions  Chaperone was present for exam.  ASSESSMENT  LLQ pain.  Status post multiple abdominal surgeries.  Malaise.  Positive urine dip.   PLAN  Counseled regarding LLQ pain.  Possible etiologies - scar tissue, ovarian remnant. Doubt GI source.  Declines need for pain medication.  Check CBC, CMP, vit D, TSH.  Urine micro and culture.  Return for pelvic ultrasound appointment.   An After Visit Summary was printed and given to the patient.  ___25___ minutes face to face time of which over 50% was spent in counseling.

## 2015-02-15 ENCOUNTER — Other Ambulatory Visit: Payer: Self-pay | Admitting: Obstetrics and Gynecology

## 2015-02-15 ENCOUNTER — Encounter: Payer: Self-pay | Admitting: Obstetrics and Gynecology

## 2015-02-15 DIAGNOSIS — D72819 Decreased white blood cell count, unspecified: Secondary | ICD-10-CM

## 2015-02-15 LAB — URINALYSIS, MICROSCOPIC ONLY
Bacteria, UA: NONE SEEN [HPF]
Casts: NONE SEEN [LPF]
Crystals: NONE SEEN [HPF]
RBC / HPF: NONE SEEN RBC/HPF (ref <=2)
SQUAMOUS EPITHELIAL / LPF: NONE SEEN [HPF] (ref <=5)
WBC, UA: NONE SEEN WBC/HPF (ref <=5)
Yeast: NONE SEEN [HPF]

## 2015-02-15 LAB — HEMOGLOBIN A1C
HEMOGLOBIN A1C: 5.9 % — AB (ref <5.7)
MEAN PLASMA GLUCOSE: 123 mg/dL — AB (ref <117)

## 2015-02-15 LAB — VITAMIN D 25 HYDROXY (VIT D DEFICIENCY, FRACTURES): Vit D, 25-Hydroxy: 39 ng/mL (ref 30–100)

## 2015-02-17 ENCOUNTER — Other Ambulatory Visit: Payer: Self-pay | Admitting: Obstetrics and Gynecology

## 2015-02-17 LAB — URINE CULTURE

## 2015-02-17 MED ORDER — PENICILLIN V POTASSIUM 250 MG PO TABS: 250.0000 mg | ORAL_TABLET | Freq: Three times a day (TID) | ORAL | Status: DC

## 2015-02-18 ENCOUNTER — Telehealth: Payer: Self-pay

## 2015-02-18 NOTE — Telephone Encounter (Signed)
Patient returned my call and I notified her of lab results.

## 2015-02-18 NOTE — Telephone Encounter (Signed)
-----   Message from Nunzio Cobbs, MD sent at 02/17/2015  7:42 PM EDT ----- Results to patient through My Chart. Please confirm that she received this message.   UC showing Group D strep.  I am treating with PCN 250 mg po tid for 7 days.  No test of cure needed.  Other results already released.

## 2015-02-18 NOTE — Telephone Encounter (Signed)
Called patient to discuss results of urine culture. Called cell # [phone] and left message to call me back.  Also called HM #[phone] but no answer.

## 2015-03-07 ENCOUNTER — Telehealth: Payer: Self-pay | Admitting: Obstetrics and Gynecology

## 2015-03-07 NOTE — Telephone Encounter (Signed)
Spoke with patient. Reviewed benefits. Patient understands and agreeable. Patient agreeable to schedule with Dr Quincy Simmonds 03/21/15. Patient aware of 72 hour cancellation policy with $676 fee. Verified arrival date/time. Ok to close.

## 2015-03-07 NOTE — Telephone Encounter (Signed)
Attempted two calls to review benefits and schedule patient for PUS for LLQ pain. Left voicemail on 03/04/15 and 03/07/15.   Please advise.

## 2015-03-08 ENCOUNTER — Telehealth: Payer: Self-pay | Admitting: Obstetrics and Gynecology

## 2015-03-08 NOTE — Telephone Encounter (Signed)
Patient would like to reschedule her upcoming ultrasound.

## 2015-03-21 ENCOUNTER — Other Ambulatory Visit: Payer: BC Managed Care – PPO | Admitting: Obstetrics and Gynecology

## 2015-03-21 ENCOUNTER — Other Ambulatory Visit: Payer: BC Managed Care – PPO

## 2015-03-28 ENCOUNTER — Encounter: Payer: Self-pay | Admitting: Obstetrics and Gynecology

## 2015-03-28 ENCOUNTER — Ambulatory Visit (INDEPENDENT_AMBULATORY_CARE_PROVIDER_SITE_OTHER): Payer: BC Managed Care – PPO

## 2015-03-28 ENCOUNTER — Ambulatory Visit (INDEPENDENT_AMBULATORY_CARE_PROVIDER_SITE_OTHER): Payer: BC Managed Care – PPO | Admitting: Obstetrics and Gynecology

## 2015-03-28 VITALS — BP 122/70 | HR 80 | Ht 64.0 in | Wt 172.0 lb

## 2015-03-28 DIAGNOSIS — R1032 Left lower quadrant pain: Secondary | ICD-10-CM | POA: Diagnosis not present

## 2015-03-28 DIAGNOSIS — R7309 Other abnormal glucose: Secondary | ICD-10-CM | POA: Diagnosis not present

## 2015-03-28 DIAGNOSIS — N3289 Other specified disorders of bladder: Secondary | ICD-10-CM | POA: Diagnosis not present

## 2015-03-28 DIAGNOSIS — R109 Unspecified abdominal pain: Secondary | ICD-10-CM | POA: Diagnosis not present

## 2015-03-28 NOTE — Progress Notes (Signed)
Subjective  49 y.o. G49P2  African American female here for pelvic ultrasound for   LLQ pain radiating to the umbilical region.  Feels a dull ache for a couple of months.  Nothing makes it better or worse.  Pain is not strong enough to miss work or wake her up.   Status post TAH and multiple myomectomies.  Had left salpingo-oophorectomy 3 -4 years ago with Dr. Brien Few for scar tissue. Right ovary remained due to more significant scar tissue on that side.   Treated for Group D strep in urine.   Pain is a little less but still there.   Has elevated hemoglobin A1C.   Objective  Pelvic ultrasound images and report reviewed with patient.  Uterus - absent.  No masses at vaginal cuff.  EMS - NA Ovaries - left ovary absent.  Right ovary normal.  No adnexal masses.  Free fluid - no Bladder cyst noted - 16 x 13 x 21 mm.  Avascular, smooth margins, echofree.     Assessment   Left sided abdominal pain.  Bladder cyst.  Status post multiple surgeries.  Elevated hemoglobin A1C.   Plan  Discussion of ultrasound findings.  Discussed possibility of scar tissue from her multiple prior surgeries.  Will refer to [facility] for evaluation of bladder cyst and any CT scan which may be indicated.  I discussed a low carb.low sugar diet, increased exercise, and weight loss to control her blood sugar level.  I recommend follow up with her PCP regarding elevated sugar.  Follow up here prn.   ___15____ minutes face to face time of which over 50% was spent in counseling.   After visit summary to patient.

## 2015-03-28 NOTE — Patient Instructions (Signed)
Please call us in one week if you have not heard back about a referral to [facility].

## 2015-05-28 ENCOUNTER — Encounter: Payer: Self-pay | Admitting: Obstetrics and Gynecology

## 2015-07-05 ENCOUNTER — Encounter: Payer: Self-pay | Admitting: Obstetrics and Gynecology

## 2015-07-29 ENCOUNTER — Other Ambulatory Visit: Payer: Self-pay

## 2015-07-29 DIAGNOSIS — Z1231 Encounter for screening mammogram for malignant neoplasm of breast: Secondary | ICD-10-CM

## 2015-08-19 ENCOUNTER — Ambulatory Visit
Admission: RE | Admit: 2015-08-19 | Discharge: 2015-08-19 | Disposition: A | Discharge status: Home / Self Care | Payer: BC Managed Care – PPO | Source: Ambulatory Visit

## 2015-08-19 DIAGNOSIS — Z1231 Encounter for screening mammogram for malignant neoplasm of breast: Secondary | ICD-10-CM

## 2015-09-27 ENCOUNTER — Ambulatory Visit (INDEPENDENT_AMBULATORY_CARE_PROVIDER_SITE_OTHER): Payer: BC Managed Care – PPO | Admitting: Obstetrics and Gynecology

## 2015-09-27 ENCOUNTER — Encounter: Payer: Self-pay | Admitting: Obstetrics and Gynecology

## 2015-09-27 VITALS — BP 124/72 | HR 76 | Resp 16 | Ht 64.0 in | Wt 174.0 lb

## 2015-09-27 DIAGNOSIS — Z113 Encounter for screening for infections with a predominantly sexual mode of transmission: Secondary | ICD-10-CM | POA: Diagnosis not present

## 2015-09-27 DIAGNOSIS — Z01419 Encounter for gynecological examination (general) (routine) without abnormal findings: Secondary | ICD-10-CM | POA: Diagnosis not present

## 2015-09-27 DIAGNOSIS — R7309 Other abnormal glucose: Secondary | ICD-10-CM

## 2015-09-27 LAB — LIPID PANEL
CHOLESTEROL: 168 mg/dL (ref 125–200)
HDL: 46 mg/dL (ref >=46)
LDL Cholesterol: 102 mg/dL (ref <130)
TRIGLYCERIDES: 102 mg/dL (ref <150)
Total CHOL/HDL Ratio: 3.7 Ratio (ref <=5.0)
VLDL: 20 mg/dL (ref <30)

## 2015-09-27 LAB — CBC
HCT: 41.2 % (ref 35.0–45.0)
Hemoglobin: 13.3 g/dL (ref 11.7–15.5)
MCH: 28.2 pg (ref 27.0–33.0)
MCHC: 32.3 g/dL (ref 32.0–36.0)
MCV: 87.3 fL (ref 80.0–100.0)
MPV: 10.6 fL (ref 7.5–12.5)
PLATELETS: 272 10*3/uL (ref 140–400)
RBC: 4.72 MIL/uL (ref 3.80–5.10)
RDW: 15.1 % — ABNORMAL HIGH (ref 11.0–15.0)
WBC: 4.1 10*3/uL (ref 3.8–10.8)

## 2015-09-27 LAB — POCT URINALYSIS DIPSTICK
Bilirubin, UA: NEGATIVE
Glucose, UA: NEGATIVE
Ketones, UA: NEGATIVE
Leukocytes, UA: NEGATIVE
NITRITE UA: NEGATIVE
Protein, UA: NEGATIVE
RBC UA: NEGATIVE
Urobilinogen, UA: NEGATIVE
pH, UA: 6

## 2015-09-27 LAB — TSH: TSH: 1.87 mIU/L

## 2015-09-27 LAB — COMPREHENSIVE METABOLIC PANEL
ALK PHOS: 76 U/L (ref 33–130)
ALT: 30 U/L — AB (ref 6–29)
AST: 25 U/L (ref 10–35)
Albumin: 4.1 g/dL (ref 3.6–5.1)
BILIRUBIN TOTAL: 0.4 mg/dL (ref 0.2–1.2)
BUN: 14 mg/dL (ref 7–25)
CO2: 25 mmol/L (ref 20–31)
Calcium: 9.4 mg/dL (ref 8.6–10.4)
Chloride: 103 mmol/L (ref 98–110)
Creat: 0.73 mg/dL (ref 0.50–1.05)
GLUCOSE: 74 mg/dL (ref 65–99)
Potassium: 4.2 mmol/L (ref 3.5–5.3)
Sodium: 141 mmol/L (ref 135–146)
Total Protein: 7.3 g/dL (ref 6.1–8.1)

## 2015-09-27 NOTE — Progress Notes (Signed)
Patient ID: Danielle Arellano, female   DOB: 1966/02/19, 50 y.o.   MRN: [id number] 51 y.o. G2P2 Married [place] American female here for annual exam.    Does not see PCP for check ups.   No night sweats.  NO vaginal dryness. [place] in 2015 - 37. Declines estrogen tx.   Has a ureterocele.  Has  Rash on her arm.  Has seen dermatologists.  Will see provider at [facility] now.  Using clobetasol.   Does not tolerate dairy.   PCP:   Kathyrn Lass, MD  Patient's last menstrual period was 06/08/2005 (approximate).           Sexually active: Yes.   maleThe current method of family planning is status post hysterectomy.    Exercising: No.  Some walking Smoker:  no  Health Maintenance: Pap: 12/09 Neg History of abnormal Pap:  no MMG:  08-19-15 3D/Density B/Neg/BiRads1:[facility] Colonoscopy:  12/2014 normal with Dr. Lenise Herald due 12/2024 per patient BMD:   n/a  Result  n/a TDaP:  05-11-08 Gardasil:   N/A HIV:  today Hep C:  today Screening Labs:  Hb today: PCP, Urine today: Neg   reports that she has never smoked. She has never used smokeless tobacco. She reports that she drinks alcohol. She reports that she does not use illicit drugs.  Past Medical History  Diagnosis Date  . No pertinent past medical history   . Carpal tunnel syndrome   . Complication of anesthesia     itched post op-not sure what-used nubain  . PONV (postoperative nausea and vomiting)     has motion sickness-used a scop patch  . Foot mass, right ganglion 01/22/2012  . Leukopenia 4/95    benign- annual CBC  . Fibroid 05/08/06    TAH retained OV  . Thyromegaly   . Elevated hemoglobin A1c 02/14/15    HgbA1C 5.9  . Bladder mass 2016    right ureterocele on cystoscopy and CT  . Ureterocele     right    Past Surgical History  Procedure Laterality Date  . Cesarean section  04,01  . Diagnostic laparoscopy      mass rt ovary  . Mass excision  01/22/2012    Procedure: EXCISION MASS;  Surgeon: Johnny Bridge,  MD;  Location: [place];  Service: Orthopedics;  Laterality: Right;  Excision mass right dorsal foot  . Total abdominal hysterectomy  2007    TAH-adhesions  . Myomectomies  10/99    Multiple    Current Outpatient Prescriptions  Medication Sig Dispense Refill  . Biotin 5 MG CAPS Take 1 capsule by mouth daily.    . Multiple Vitamin (MULTI-VITAMINS PO) Take by mouth daily.    . Omega-3 Fatty Acids (FISH OIL) 1000 MG CAPS Take by mouth daily.     No current facility-administered medications for this visit.    Family History  Problem Relation Age of Onset  . Epilepsy Father   . Stroke Father   . Arthritis Mother     ROS:  Pertinent items are noted in HPI.  Otherwise, a comprehensive ROS was negative.  Exam:   BP 124/72 mmHg  Pulse 76  Resp 16  Ht 5\' 4"  (1.626 m)  Wt 174 lb (78.926 kg)  BMI 29.85 kg/m2  LMP 06/08/2005 (Approximate)    General appearance: alert, cooperative and appears stated age Head: Normocephalic, without obvious abnormality, atraumatic Neck: no adenopathy, supple, symmetrical, trachea midline and thyroid normal to inspection and palpation  Lungs: clear to auscultation bilaterally Breasts: normal appearance, no masses or tenderness, Inspection negative, No nipple retraction or dimpling, No nipple discharge or bleeding, No axillary or supraclavicular adenopathy Heart: regular rate and rhythm Abdomen: incisions:  Yes.    Multiple scars , soft, non-tender; no masses, no organomegaly Extremities: extremities normal, atraumatic, no cyanosis or edema Skin: Skin color, texture, turgor normal. Raised rash under the right arm and on right breast.  Lymph nodes: Cervical, supraclavicular, and axillary nodes normal. No abnormal inguinal nodes palpated Neurologic: Grossly normal  Pelvic: External genitalia:  no lesions              Urethra:  normal appearing urethra with no masses, tenderness or lesions              Bartholins and Skenes: normal                  Vagina: normal appearing vagina with normal color and discharge, no lesions              Cervix: absent              Pap taken: No. Bimanual Exam:  Uterus:  uterus absent              Adnexa: no mass, fullness, tenderness              Rectal exam: Yes.  .  Confirms.              Anus:  normal sphincter tone, no lesions  Chaperone was present for exam.  Assessment:   Well woman visit with normal exam. Status post TAH.  Status post LSO.  Right ovary remains.  Hx thyromegaly.  Elevated hemoglobin A1C.   Plan: Yearly mammogram recommended after age 27.  Recommended self breast exam.  Pap and HR HPV as above. Discussed Calcium, Vitamin D, regular exercise program including cardiovascular and weight bearing exercise. Labs performed.  Yes.  .   See orders.  Routine labs, hemoglobin A1C, HIV, hep C. Prescription medication(s) given.  No..    Discussed importance of normalizing blood sugar levels, low carb and low sugar diet.  Follow up annually and prn.       After visit summary provided.

## 2015-09-27 NOTE — Patient Instructions (Signed)

## 2015-09-28 LAB — HEPATITIS C ANTIBODY: HCV Ab: NEGATIVE

## 2015-09-28 LAB — HEMOGLOBIN A1C
HEMOGLOBIN A1C: 5.9 % — AB (ref <5.7)
MEAN PLASMA GLUCOSE: 123 mg/dL

## 2015-09-28 LAB — HIV ANTIBODY (ROUTINE TESTING W REFLEX): HIV: NONREACTIVE

## 2015-10-26 DIAGNOSIS — D485 Neoplasm of uncertain behavior of skin: Secondary | ICD-10-CM | POA: Insufficient documentation

## 2015-10-26 DIAGNOSIS — L439 Lichen planus, unspecified: Secondary | ICD-10-CM | POA: Insufficient documentation

## 2016-03-12 DIAGNOSIS — L819 Disorder of pigmentation, unspecified: Secondary | ICD-10-CM | POA: Insufficient documentation

## 2016-07-29 ENCOUNTER — Other Ambulatory Visit: Payer: Self-pay | Admitting: Obstetrics and Gynecology

## 2016-07-29 DIAGNOSIS — Z1231 Encounter for screening mammogram for malignant neoplasm of breast: Secondary | ICD-10-CM

## 2016-08-19 ENCOUNTER — Ambulatory Visit
Admission: RE | Admit: 2016-08-19 | Discharge: 2016-08-19 | Disposition: A | Discharge status: Home / Self Care | Payer: BC Managed Care – PPO | Source: Ambulatory Visit | Attending: Obstetrics and Gynecology | Admitting: Obstetrics and Gynecology

## 2016-08-19 DIAGNOSIS — Z1231 Encounter for screening mammogram for malignant neoplasm of breast: Secondary | ICD-10-CM

## 2016-10-02 ENCOUNTER — Encounter: Payer: Self-pay | Admitting: Obstetrics and Gynecology

## 2016-10-02 ENCOUNTER — Ambulatory Visit (INDEPENDENT_AMBULATORY_CARE_PROVIDER_SITE_OTHER): Payer: BC Managed Care – PPO | Admitting: Obstetrics and Gynecology

## 2016-10-02 VITALS — BP 110/68 | HR 78 | Resp 16 | Ht 63.5 in | Wt 171.4 lb

## 2016-10-02 DIAGNOSIS — Z01419 Encounter for gynecological examination (general) (routine) without abnormal findings: Secondary | ICD-10-CM

## 2016-10-02 LAB — COMPREHENSIVE METABOLIC PANEL
ALBUMIN: 4 g/dL (ref 3.6–5.1)
ALT: 20 U/L (ref 6–29)
AST: 22 U/L (ref 10–35)
Alkaline Phosphatase: 61 U/L (ref 33–130)
BILIRUBIN TOTAL: 0.3 mg/dL (ref 0.2–1.2)
BUN: 15 mg/dL (ref 7–25)
CO2: 23 mmol/L (ref 20–31)
CREATININE: 0.8 mg/dL (ref 0.50–1.05)
Calcium: 9.5 mg/dL (ref 8.6–10.4)
Chloride: 107 mmol/L (ref 98–110)
Glucose, Bld: 99 mg/dL (ref 65–99)
Potassium: 3.9 mmol/L (ref 3.5–5.3)
SODIUM: 142 mmol/L (ref 135–146)
TOTAL PROTEIN: 7 g/dL (ref 6.1–8.1)

## 2016-10-02 LAB — LIPID PANEL
CHOLESTEROL: 146 mg/dL (ref <200)
HDL: 52 mg/dL (ref >50)
LDL Cholesterol: 80 mg/dL (ref <100)
Total CHOL/HDL Ratio: 2.8 Ratio (ref <5.0)
Triglycerides: 69 mg/dL (ref <150)
VLDL: 14 mg/dL (ref <30)

## 2016-10-02 LAB — CBC
HCT: 38 % (ref 35.0–45.0)
HEMOGLOBIN: 12.3 g/dL (ref 11.7–15.5)
MCH: 28.3 pg (ref 27.0–33.0)
MCHC: 32.4 g/dL (ref 32.0–36.0)
MCV: 87.4 fL (ref 80.0–100.0)
MPV: 10.8 fL (ref 7.5–12.5)
Platelets: 273 10*3/uL (ref 140–400)
RBC: 4.35 MIL/uL (ref 3.80–5.10)
RDW: 14.7 % (ref 11.0–15.0)
WBC: 3.3 10*3/uL — AB (ref 3.8–10.8)

## 2016-10-02 NOTE — Patient Instructions (Signed)

## 2016-10-02 NOTE — Progress Notes (Signed)
51 y.o. G2P2 Married [place] American female here for annual exam.    No significant hot flashes.   Asking about shingles vaccine.  Already had shingles.  Trying to limit sugar. Lost 5 - 6 pounds.   Taking Plaquinil for lichen planus.  Trying to be a stem cell donor for her brother who has multiple myeloma. This will be done in May 2018.  Patient question--Does she need the shingles vaccine if she has had shingles before?   PCP:  Dr. Sigmund Hazel   Patient's last menstrual period was 06/08/2005 (approximate).           Sexually active: Yes.    The current method of family planning is status post hysterectomy.    Exercising: Yes.    Walking, light weights Smoker:  no  Health Maintenance: Pap: 12/09 Neg History of abnormal Pap:  no MMG:  08-19-16 3D/Density B/Neg/BiRads1:[facility] Colonoscopy:  12/2014 normal with Dr. Thelma Comp due 12/2024 per patient BMD:   n/a  Result  n/a TDaP:  05-11-08 Gardasil:   N/A HIV: 09/27/15 Neg Hep C: 09/27/15 Neg Screening Labs: today.    reports that she has never smoked. She has never used smokeless tobacco. She reports that she drinks alcohol. She reports that she does not use drugs.  Past Medical History:  Diagnosis Date  . Bladder mass 2016   right ureterocele on cystoscopy and CT  . Carpal tunnel syndrome   . Complication of anesthesia    itched post op-not sure what-used nubain  . Elevated hemoglobin A1c 02/14/15   HgbA1C 5.9  . Fibroid 05/08/06   TAH retained OV  . Foot mass, right ganglion 01/22/2012  . 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 No pertinent past medical history   . PONV (postoperative nausea and vomiting)    has motion sickness-used a scop patch  . Thyromegaly    Normal thyroid US in 2014.  Marland Kitchen Ureterocele    right    Past Surgical History:  Procedure Laterality Date  . CESAREAN SECTION  04,01  . DIAGNOSTIC LAPAROSCOPY     mass rt ovary  . MASS EXCISION  01/22/2012   Procedure: EXCISION MASS;   Surgeon: Eulas Post, MD;  Location: [facility];  Service: Orthopedics;  Laterality: Right;  Excision mass right dorsal foot  . Myomectomies  10/99   Multiple  . TOTAL ABDOMINAL HYSTERECTOMY  2007   TAH-adhesions    Current Outpatient Prescriptions  Medication Sig Dispense Refill  . Biotin 5 MG CAPS Take 1 capsule by mouth daily.    . Fluocin-Hydroquinone-Tretinoin 0.01-4-0.05 % CREA Apply to affected areas on arm daily for 3 mo, then 3 times per week    . hydroxychloroquine (PLAQUENIL) 200 MG tablet Take 200 mg by mouth.    . Multiple Vitamin (MULTI-VITAMINS PO) Take by mouth daily.    . Omega-3 Fatty Acids (FISH OIL) 1000 MG CAPS Take by mouth daily.    Marland Kitchen XIIDRA 5 % SOLN      No current facility-administered medications for this visit.     Family History  Problem Relation Age of Onset  . Epilepsy Father   . Stroke Father   . Arthritis Mother     ROS:  Pertinent items are noted in HPI.  Otherwise, a comprehensive ROS was negative.  Exam:   BP 110/68 (BP Location: Right Arm, Patient Position: Sitting, Cuff Size: Normal)   Pulse 78   Resp 16   Ht 5'  3.5" (1.613 m)   Wt 171 lb 6.4 oz (77.7 kg)   LMP 06/08/2005 (Approximate)   BMI 29.89 kg/m     General appearance: alert, cooperative and appears stated age Head: Normocephalic, without obvious abnormality, atraumatic Neck: no adenopathy, supple, symmetrical, trachea midline and thyroid normal to inspection and palpation Lungs: clear to auscultation bilaterally Breasts: normal appearance, no masses or tenderness, No nipple retraction or dimpling, No nipple discharge or bleeding, No axillary or supraclavicular adenopathy Heart: regular rate and rhythm Abdomen: soft, non-tender; no masses, no organomegaly Extremities: extremities normal, atraumatic, no cyanosis or edema Skin: Skin color, texture, turgor normal.  Scattered brown pigmented rash of right arm.  Lymph nodes: Cervical, supraclavicular, and axillary  nodes normal. No abnormal inguinal nodes palpated Neurologic: Grossly normal  Pelvic: External genitalia:  no lesions              Urethra:  normal appearing urethra with no masses, tenderness or lesions              Bartholins and Skenes: normal                 Vagina: normal appearing vagina with normal color and discharge, no lesions              Cervix:  absent              Pap taken: No. Bimanual Exam:  Uterus: absent              Adnexa: no mass, fullness, tenderness              Rectal exam: Yes.  .  Confirms.              Anus:  normal sphincter tone, no lesions  Chaperone was present for exam.  Assessment:   Well woman visit with normal exam. Status post TAH.  Status post LSO.  Right ovary remains.  Ureterocele. Hx thyromegaly.  Elevated hemoglobin A1C.  Lichen planus.   Plan: Mammogram screening discussed. Recommended self breast awareness. Pap and HR HPV as above. Guidelines for Calcium, Vitamin D, regular exercise program including cardiovascular and weight bearing exercise. Routine labs including HgbA1C.  Patient is to be upcoming bone marrow donor for her brother.  She will not do her shingles vaccine prior to this.  Follow up annually and prn.   After visit summary provided.

## 2016-10-03 LAB — HEMOGLOBIN A1C
HEMOGLOBIN A1C: 5.2 % (ref <5.7)
MEAN PLASMA GLUCOSE: 103 mg/dL

## 2016-10-03 LAB — VITAMIN D 25 HYDROXY (VIT D DEFICIENCY, FRACTURES): VIT D 25 HYDROXY: 47 ng/mL (ref 30–100)

## 2016-10-03 LAB — TSH: TSH: 1.35 mIU/L

## 2016-10-05 ENCOUNTER — Telehealth: Payer: Self-pay

## 2016-10-05 NOTE — Telephone Encounter (Signed)
-----   Message from Nunzio Cobbs, MD sent at 10/03/2016  8:07 PM EDT ----- Please contact patient with lab results. Her CBC showed a slightly low WBC, which is not unusual for her.  By her history she has benign leukopenia.  I am not recommending anything further at this time.   The rest of her blood work is normal which includes the CMP, lipids, thyroid, hemoglobin A1C, and vit D.

## 2016-10-05 NOTE — Telephone Encounter (Signed)
Attempted to reach patient at number provided [phone], there was no answer and recording states that the voicemail box is full any not accepting messages at this time.

## 2016-10-13 NOTE — Telephone Encounter (Signed)
Attempted to reach patient at number provided [phone], there was no answer and recording states that the voicemail box is full and not accepting messages at this time.

## 2016-10-23 DIAGNOSIS — L292 Pruritus vulvae: Secondary | ICD-10-CM | POA: Insufficient documentation

## 2016-10-23 DIAGNOSIS — Z52001 Unspecified donor, stem cells: Secondary | ICD-10-CM | POA: Insufficient documentation

## 2016-10-23 DIAGNOSIS — Z872 Personal history of diseases of the skin and subcutaneous tissue: Secondary | ICD-10-CM | POA: Insufficient documentation

## 2016-11-09 NOTE — Telephone Encounter (Signed)
Dr.Silva, I have attempted to reach this patient x 2 with no return call. Please advise. 

## 2016-11-09 NOTE — Telephone Encounter (Signed)
Please send a letter to patient with her lab results.  It looks like she is now on My Chart but was not at the time of her office visit.

## 2016-11-10 NOTE — Telephone Encounter (Signed)
Letter sent via MyChart and to Dr.Silva's desk for review upon return to the office for sending via mail to the patient.

## 2016-11-11 NOTE — Telephone Encounter (Signed)
Signed letter mailed to patient's home address on file.  Routing to provider for final review. Patient agreeable to disposition. Will close encounter.

## 2017-08-12 ENCOUNTER — Other Ambulatory Visit: Payer: Self-pay | Admitting: Obstetrics and Gynecology

## 2017-08-12 DIAGNOSIS — Z139 Encounter for screening, unspecified: Secondary | ICD-10-CM

## 2017-08-31 ENCOUNTER — Ambulatory Visit
Admission: RE | Admit: 2017-08-31 | Discharge: 2017-08-31 | Disposition: A | Discharge status: Home / Self Care | Payer: BC Managed Care – PPO | Source: Ambulatory Visit | Attending: Obstetrics and Gynecology | Admitting: Obstetrics and Gynecology

## 2017-08-31 DIAGNOSIS — Z139 Encounter for screening, unspecified: Secondary | ICD-10-CM

## 2017-09-03 ENCOUNTER — Ambulatory Visit: Payer: BC Managed Care – PPO

## 2017-10-06 ENCOUNTER — Encounter: Payer: Self-pay | Admitting: Obstetrics and Gynecology

## 2017-10-06 ENCOUNTER — Ambulatory Visit (INDEPENDENT_AMBULATORY_CARE_PROVIDER_SITE_OTHER): Payer: BC Managed Care – PPO | Admitting: Obstetrics and Gynecology

## 2017-10-06 ENCOUNTER — Other Ambulatory Visit: Payer: Self-pay

## 2017-10-06 VITALS — BP 120/56 | HR 88 | Resp 14 | Ht 63.5 in | Wt 177.2 lb

## 2017-10-06 DIAGNOSIS — Z01419 Encounter for gynecological examination (general) (routine) without abnormal findings: Secondary | ICD-10-CM

## 2017-10-06 NOTE — Patient Instructions (Addendum)

## 2017-10-06 NOTE — Progress Notes (Signed)
52 y.o. G2P2 Married [place] American female here for annual exam.    Hot flashes are over and done.  Occasional night sweat.   Not happy with weight.  Lost 20 pounds in the past.  Has lichen planus that comes and goes.  Off Plaquenil now.  Uses Clobetasol.   Bone marrow transplant for brother with multiple myeloma.   May retire next year. Working for 31 years.   PCP: Dr. Kathyrn Lass     Patient's last menstrual period was 06/08/2005 (approximate).           Sexually active: Yes.    The current method of family planning is status post hysterectomy.    Exercising: Yes.    walking Smoker:  no  Health Maintenance: Pap:  05/2008 negative  History of abnormal Pap:  no MMG:  09/01/17 BIRADS 1 negative/density c Colonoscopy:  12/2014 normal with Dr. Collene Mares, next due 12/2024  BMD:   n/a Result  n/a TDaP:  05/11/08  HIV: 09/27/15 negative  Hep C: 09/27/15 negative  Screening Labs:  Discuss today   reports that she has never smoked. She has never used smokeless tobacco. She reports that she drank alcohol. She reports that she does not use drugs.  Past Medical History:  Diagnosis Date  . Bladder mass 2016   right ureterocele on cystoscopy and CT  . Carpal tunnel syndrome   . Complication of anesthesia    itched post op-not sure what-used nubain  . Elevated hemoglobin A1c 02/14/15   HgbA1C 5.9  . Fibroid 05/08/06   TAH retained OV  . Foot mass, right ganglion 01/22/2012  . 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 No pertinent past medical history   . PONV (postoperative nausea and vomiting)    has motion sickness-used a scop patch  . Thyromegaly    Normal thyroid US in 2014.  Marland Kitchen Ureterocele    right    Past Surgical History:  Procedure Laterality Date  . CESAREAN SECTION  04,01  . DIAGNOSTIC LAPAROSCOPY     mass rt ovary  . MASS EXCISION  01/22/2012   Procedure: EXCISION MASS;  Surgeon: Johnny Bridge, MD;  Location: [facility];  Service: Orthopedics;   Laterality: Right;  Excision mass right dorsal foot  . Myomectomies  10/99   Multiple  . TOTAL ABDOMINAL HYSTERECTOMY  2007   TAH-adhesions    Current Outpatient Medications  Medication Sig Dispense Refill  . Biotin 5 MG CAPS Take 1 capsule by mouth daily.    . Clobetasol Propionate 0.05 % lotion Apply topically as needed.    . Fluocin-Hydroquinone-Tretinoin 0.01-4-0.05 % CREA Apply to affected areas on arm daily for 3 mo, then 3 times per week    . Multiple Vitamin (MULTI-VITAMINS PO) Take by mouth daily.    . multivitamin-lutein (OCUVITE-LUTEIN) CAPS capsule Take 1 capsule by mouth daily.    . Omega-3 Fatty Acids (FISH OIL) 1000 MG CAPS Take by mouth daily.    Marland Kitchen XIIDRA 5 % SOLN      No current facility-administered medications for this visit.     Family History  Problem Relation Age of Onset  . Epilepsy Father   . Stroke Father   . Arthritis Mother   . Multiple myeloma Brother     Review of Systems  Constitutional: Negative.   HENT: Negative.   Eyes: Negative.   Respiratory: Negative.   Cardiovascular: Negative.   Gastrointestinal: Negative.   Endocrine: Negative.   Genitourinary:  Negative.   Musculoskeletal: Negative.   Skin: Negative.   Allergic/Immunologic: Negative.   Neurological: Negative.   Hematological: Negative.   Psychiatric/Behavioral: Negative.     Exam:   BP (!) 120/56 (BP Location: Right Arm, Patient Position: Sitting, Cuff Size: Normal)   Pulse 88   Resp 14   Ht 5' 3.5" (1.613 m)   Wt 177 lb 4 oz (80.4 kg)   LMP 06/08/2005 (Approximate)   BMI 30.91 kg/m     General appearance: alert, cooperative and appears stated age Head: Normocephalic, without obvious abnormality, atraumatic Neck: no adenopathy, supple, symmetrical, trachea midline and thyroid normal to inspection and palpation Lungs: clear to auscultation bilaterally Breasts: normal appearance, no masses or tenderness, No nipple retraction or dimpling, No nipple discharge or bleeding, No  axillary or supraclavicular adenopathy Heart: regular rate and rhythm Abdomen: soft, non-tender; no masses, no organomegaly Extremities: extremities normal, atraumatic, no cyanosis or edema Skin: Skin color, texture, turgor normal. No rashes or lesions Lymph nodes: Cervical, supraclavicular, and axillary nodes normal. No abnormal inguinal nodes palpated Neurologic: Grossly normal  Pelvic: External genitalia:  no lesions              Urethra:  normal appearing urethra with no masses, tenderness or lesions              Bartholins and Skenes: normal                 Vagina: normal appearing vagina with normal color and discharge, no lesions              Cervix:  absent              Pap taken: No. Bimanual Exam:  Uterus:   Absent.              Adnexa: no mass, fullness, tenderness              Rectal exam: Yes.  .  Confirms.              Anus:  normal sphincter tone, no lesions  Chaperone was present for exam.  Assessment:   Well woman visit with normal exam. Status post TAH.  Status post LSO.  Right ovary remains.  Ureterocele. Hx thyromegaly.  Hx elevated hemoglobin A1C.  Lichen planus.   Plan: Mammogram screening. Recommended self breast awareness. Pap and HR HPV as above. Guidelines for Calcium, Vitamin D, regular exercise program including cardiovascular and weight bearing exercise. Discussed weight loss through diet and exercise.  Routine labs including HgbA1C.  Follow up annually and prn.   After visit summary provided.

## 2017-10-07 LAB — COMPREHENSIVE METABOLIC PANEL
ALT: 43 IU/L — AB (ref 0–32)
AST: 30 IU/L (ref 0–40)
Albumin/Globulin Ratio: 1.4 (ref 1.2–2.2)
Albumin: 4.2 g/dL (ref 3.5–5.5)
Alkaline Phosphatase: 87 IU/L (ref 39–117)
BUN/Creatinine Ratio: 16 (ref 9–23)
BUN: 11 mg/dL (ref 6–24)
Bilirubin Total: 0.3 mg/dL (ref 0.0–1.2)
CALCIUM: 9.7 mg/dL (ref 8.7–10.2)
CO2: 25 mmol/L (ref 20–29)
CREATININE: 0.68 mg/dL (ref 0.57–1.00)
Chloride: 104 mmol/L (ref 96–106)
GFR calc Af Amer: 115 mL/min/{1.73_m2} (ref >59)
GFR, EST NON AFRICAN AMERICAN: 100 mL/min/{1.73_m2} (ref >59)
Globulin, Total: 3 g/dL (ref 1.5–4.5)
Glucose: 74 mg/dL (ref 65–99)
Potassium: 4.3 mmol/L (ref 3.5–5.2)
Sodium: 144 mmol/L (ref 134–144)
Total Protein: 7.2 g/dL (ref 6.0–8.5)

## 2017-10-07 LAB — TSH: TSH: 2.01 u[IU]/mL (ref 0.450–4.500)

## 2017-10-07 LAB — CBC
HEMATOCRIT: 41.1 % (ref 34.0–46.6)
Hemoglobin: 13.5 g/dL (ref 11.1–15.9)
MCH: 29.5 pg (ref 26.6–33.0)
MCHC: 32.8 g/dL (ref 31.5–35.7)
MCV: 90 fL (ref 79–97)
PLATELETS: 288 10*3/uL (ref 150–379)
RBC: 4.57 x10E6/uL (ref 3.77–5.28)
RDW: 15.2 % (ref 12.3–15.4)
WBC: 3.4 10*3/uL (ref 3.4–10.8)

## 2017-10-07 LAB — LIPID PANEL
Chol/HDL Ratio: 3.3 ratio (ref 0.0–4.4)
Cholesterol, Total: 169 mg/dL (ref 100–199)
HDL: 51 mg/dL (ref >39)
LDL CALC: 102 mg/dL — AB (ref 0–99)
Triglycerides: 82 mg/dL (ref 0–149)
VLDL CHOLESTEROL CAL: 16 mg/dL (ref 5–40)

## 2017-10-07 LAB — HEMOGLOBIN A1C
ESTIMATED AVERAGE GLUCOSE: 120 mg/dL
Hgb A1c MFr Bld: 5.8 % — ABNORMAL HIGH (ref 4.8–5.6)

## 2017-10-12 ENCOUNTER — Telehealth: Payer: Self-pay | Admitting: *Deleted

## 2017-10-12 NOTE — Telephone Encounter (Signed)
Spoke with patient, advised as seen below per Dr. Quincy Simmonds. Patient declines assistance with scheduling with PCP/Dr. Kathyrn Lass. 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 Patient request copy of lab results to PCP. Patient verbalizes understanding.  Lab results dated 10/07/17 faxed via Epic to Dr. Sabra Heck.   Will close encounter.

## 2017-10-12 NOTE — Telephone Encounter (Signed)
-----   Message from Nunzio Cobbs, MD sent at 10/07/2017  5:36 PM EDT ----- Please contact patient with her lab results.  Blood counts and thyroid are normal.  Her cholesterol panel shows minimally elevated LDL,and the ratios of the cholesterol all look good.  Her hemoglobin A1C is back into a prediabetic range, and her liver function is elevated.  Please make an appointment for her to see her PCP in follow up to this.

## 2017-10-12 NOTE — Telephone Encounter (Signed)
Notes recorded by Burnice Logan, RN on 10/12/2017 at 10:40 AM EDT Left message to call Sharee Pimple at [phone].

## 2018-07-06 NOTE — Progress Notes (Signed)
 Subjective:    Patient ID: Danielle Arellano, female    DOB: 11/15/1965, 52 y.o.   MRN: [id number]  HPI:  Danielle Arellano is here to establish as a new pt.   She is a pleasant 52 year old female. PMH: Chronic Dry eye- followed by Ophthalmologist Q9M- on several eye gtt's Tonsillolith, Loud Snoring, Daytime Fatigue, Varicose Veins She has recently starting a regular walking program - 3 days week for 20mins and plans on starting light weight lifting program March. She estimates to only drink 20-30 oz water/day, prefers to hydrate with juice and sweat tea. She denies tobacco/vape use/ETOH use She states "my husband tells me that I stop breathing at night and I bring that house down with my snoring" She underwent stem cell transplant summer 2018, to assist with her brother's treatment of Multiple Myeloma She feels that her overall health is "excellent"  Patient Care Team    Relationship Specialty Notifications Start End  Danford, Katy D, NP PCP - General Family Medicine  07/13/18   Tafeen, Stuart, MD Consulting Physician Dermatology  07/13/18   McMichael, Amy J, MD  Dermatology  07/13/18   Amundson C Silva, Brook E, MD Consulting Physician Obstetrics and Gynecology  07/13/18   Patel, Niyati, OD Referring Physician Optometry  07/13/18     Patient Active Problem List   Diagnosis Date Noted  . Tonsillolith 07/13/2018  . Fatigue 07/13/2018  . Loud snoring 07/13/2018  . Healthcare maintenance 07/13/2018  . Laboratory exam ordered as part of routine general medical examination 02/14/2015  . Elevated hemoglobin A1c 02/14/2015  . 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 Foot mass, right ganglion 01/22/2012     Past Medical History:  Diagnosis Date  . Bladder mass 2016   right ureterocele on cystoscopy and CT  . Carpal tunnel syndrome   . Complication of anesthesia    itched post op-not sure what-used nubain  . Elevated hemoglobin A1c 02/14/15   HgbA1C 5.9  . Fibroid 05/08/06   TAH retained OV  .  Foot mass, right ganglion 01/22/2012  . 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 No pertinent past medical history   . PONV (postoperative nausea and vomiting)    has motion sickness-used a scop patch  . Thyromegaly    Normal thyroid US in 2014.  . Ureterocele    right     Past Surgical History:  Procedure Laterality Date  . CESAREAN SECTION  04,01  . DIAGNOSTIC LAPAROSCOPY     mass rt ovary  . MASS EXCISION  01/22/2012   Procedure: EXCISION MASS;  Surgeon: Joshua P Landau, MD;  Location: [facility];  Service: Orthopedics;  Laterality: Right;  Excision mass right dorsal foot  . Myomectomies  10/99   Multiple  . TOTAL ABDOMINAL HYSTERECTOMY  2007   TAH-adhesions     Family History  Problem Relation Age of Onset  . Epilepsy Father   . Stroke Father   . Arthritis Mother   . Multiple myeloma Brother   . Cancer Brother        multiple myeloma     Social History   Substance and Sexual Activity  Drug Use No     Social History   Substance and Sexual Activity  Alcohol Use Not Currently  . Alcohol/week: 0.0 standard drinks  . Frequency: Never     Social History   Tobacco Use  Smoking Status Never Smoker  Smokeless Tobacco Never Used       Outpatient Encounter Medications as of 07/13/2018  Medication Sig  . Biotin 5 MG CAPS Take 1 capsule by mouth daily.  . Multiple Vitamin (MULTI-VITAMINS PO) Take by mouth daily.  . Omega-3 Fatty Acids (FISH OIL) 1000 MG CAPS Take by mouth daily.  . XIIDRA 5 % SOLN   . [DISCONTINUED] Clobetasol Propionate 0.05 % lotion Apply topically as needed.  . [DISCONTINUED] Fluocin-Hydroquinone-Tretinoin 0.01-4-0.05 % CREA Apply to affected areas on arm daily for 3 mo, then 3 times per week  . [DISCONTINUED] multivitamin-lutein (OCUVITE-LUTEIN) CAPS capsule Take 1 capsule by mouth daily.   No facility-administered encounter medications on file as of 07/13/2018.     Allergies: Patient has no known allergies.  Body mass  index is 30.55 kg/m.  Blood pressure 118/78, pulse 85, temperature 98.7 F (37.1 C), temperature source Oral, height 5' 4.25" (1.632 m), weight 179 lb 6.4 oz (81.4 kg), last menstrual period 06/08/2005, SpO2 97 %.     Review of Systems  Constitutional: Positive for fatigue. Negative for activity change, appetite change, chills, diaphoresis, fever and unexpected weight change.  HENT: Negative for congestion, dental problem and drooling.        Recurrent tonsillolith   Eyes: Negative for visual disturbance.  Respiratory: Negative for cough, chest tightness, shortness of breath, wheezing and stridor.   Cardiovascular: Negative for chest pain, palpitations and leg swelling.  Gastrointestinal: Negative for abdominal distention, abdominal pain, blood in stool, constipation, diarrhea, nausea and vomiting.  Endocrine: Negative for cold intolerance, heat intolerance, polydipsia, polyphagia and polyuria.  Genitourinary: Negative for difficulty urinating and flank pain.  Musculoskeletal: Negative for arthralgias, back pain, gait problem, joint swelling, myalgias, neck pain and neck stiffness.  Skin: Negative for color change, pallor, rash and wound.  Neurological: Negative for dizziness and light-headedness.  Hematological: Does not bruise/bleed easily.  Psychiatric/Behavioral: Positive for sleep disturbance.       Objective:   Physical Exam Vitals signs and nursing note reviewed.  Constitutional:      General: She is not in acute distress.    Appearance: She is not ill-appearing, toxic-appearing or diaphoretic.  HENT:     Mouth/Throat:     Mouth: Mucous membranes are moist.     Pharynx: Oropharynx is clear. No oropharyngeal exudate or posterior oropharyngeal erythema.     Comments: Unable to visualize the back of the throat- tongue obstructing view  Cardiovascular:     Rate and Rhythm: Normal rate.     Pulses: Normal pulses.     Heart sounds: Normal heart sounds. No murmur. No friction  rub. No gallop.   Pulmonary:     Effort: Pulmonary effort is normal. No respiratory distress.     Breath sounds: Normal breath sounds. No stridor. No wheezing, rhonchi or rales.  Skin:    General: Skin is warm.     Capillary Refill: Capillary refill takes less than 2 seconds.  Neurological:     Mental Status: She is alert and oriented to person, place, and time.  Psychiatric:        Mood and Affect: Mood normal.        Behavior: Behavior normal.        Thought Content: Thought content normal.        Judgment: Judgment normal.       Assessment & Plan:   1. Need for Tdap vaccination   2. Loud snoring   3. Fatigue, unspecified type   4. Tonsillolith   5. Healthcare maintenance     Tonsillolith   ENT referral placed  Loud snoring Pulmonology referral placed  Healthcare maintenance  Increase water intake, strive for at least 85 ounces/day.   Follow Mediterranean diet Increase regular exercise.  Recommend at least 30 minutes daily, 5 days per week of walking, jogging, biking, swimming, YouTube/Pinterest workout videos. Referral to Pulmonology, re: Sleep Study Referral to ENT, re: Tonsillolith Follow-up in 3 months for complete physical, please schedule a fasting lab app the week prior.  Fatigue Pulmonology referral placed     FOLLOW-UP:  Return in about 3 months (around 10/11/2018) for CPE, Fasting Labs. 

## 2018-07-13 ENCOUNTER — Encounter: Payer: Self-pay | Admitting: Adult Health

## 2018-07-13 ENCOUNTER — Ambulatory Visit (INDEPENDENT_AMBULATORY_CARE_PROVIDER_SITE_OTHER): Payer: BC Managed Care – PPO | Admitting: Adult Health

## 2018-07-13 VITALS — BP 118/78 | HR 85 | Temp 98.7°F | Ht 64.25 in | Wt 179.4 lb

## 2018-07-13 DIAGNOSIS — R0683 Snoring: Secondary | ICD-10-CM | POA: Diagnosis not present

## 2018-07-13 DIAGNOSIS — J358 Other chronic diseases of tonsils and adenoids: Secondary | ICD-10-CM | POA: Diagnosis not present

## 2018-07-13 DIAGNOSIS — R5383 Other fatigue: Secondary | ICD-10-CM

## 2018-07-13 DIAGNOSIS — Z23 Encounter for immunization: Secondary | ICD-10-CM | POA: Diagnosis not present

## 2018-07-13 DIAGNOSIS — Z0000 Encounter for general adult medical examination without abnormal findings: Secondary | ICD-10-CM | POA: Insufficient documentation

## 2018-07-13 NOTE — Assessment & Plan Note (Signed)
  Increase water intake, strive for at least 85 ounces/day.   Follow Mediterranean diet Increase regular exercise.  Recommend at least 30 minutes daily, 5 days per week of walking, jogging, biking, swimming, YouTube/Pinterest workout videos. Referral to Pulmonology, re: Sleep Study Referral to ENT, re: Tonsillolith Follow-up in 3 months for complete physical, please schedule a fasting lab app the week prior.

## 2018-07-13 NOTE — Assessment & Plan Note (Signed)
Pulmonology referral placed.

## 2018-07-13 NOTE — Patient Instructions (Addendum)
Mediterranean Diet A Mediterranean diet refers to food and lifestyle choices that are based on the traditions of countries located on the [employer]. This way of eating has been shown to help prevent certain conditions and improve outcomes for people who have chronic diseases, like kidney disease and heart disease. What are tips for following this plan? Lifestyle  Cook and eat meals together with your family, when possible.  Drink enough fluid to keep your urine clear or pale yellow.  Be physically active every day. This includes: ? Aerobic exercise like running or swimming. ? Leisure activities like gardening, walking, or housework.  Get 7-8 hours of sleep each night.  If recommended by your health care provider, drink red wine in moderation. This means 1 glass a day for nonpregnant women and 2 glasses a day for men. A glass of wine equals 5 oz (150 mL). Reading food labels   Check the serving size of packaged foods. For foods such as rice and pasta, the serving size refers to the amount of cooked product, not dry.  Check the total fat in packaged foods. Avoid foods that have saturated fat or trans fats.  Check the ingredients list for added sugars, such as corn syrup. Shopping  At the grocery store, buy most of your food from the areas near the walls of the store. This includes: ? Fresh fruits and vegetables (produce). ? Grains, beans, nuts, and seeds. Some of these may be available in unpackaged forms or large amounts (in bulk). ? Fresh seafood. ? Poultry and eggs. ? Low-fat dairy products.  Buy whole ingredients instead of prepackaged foods.  Buy fresh fruits and vegetables in-season from local farmers markets.  Buy frozen fruits and vegetables in resealable bags.  If you do not have access to quality fresh seafood, buy precooked frozen shrimp or canned fish, such as tuna, salmon, or sardines.  Buy small amounts of raw or cooked vegetables, salads, or olives from  the deli or salad bar at your store.  Stock your pantry so you always have certain foods on hand, such as olive oil, canned tuna, canned tomatoes, rice, pasta, and beans. Cooking  Cook foods with extra-virgin olive oil instead of using butter or other vegetable oils.  Have meat as a side dish, and have vegetables or grains as your main dish. This means having meat in small portions or adding small amounts of meat to foods like pasta or stew.  Use beans or vegetables instead of meat in common dishes like chili or lasagna.  Experiment with different cooking methods. Try roasting or broiling vegetables instead of steaming or sauteing them.  Add frozen vegetables to soups, stews, pasta, or rice.  Add nuts or seeds for added healthy fat at each meal. You can add these to yogurt, salads, or vegetable dishes.  Marinate fish or vegetables using olive oil, lemon juice, garlic, and fresh herbs. Meal planning   Plan to eat 1 vegetarian meal one day each week. Try to work up to 2 vegetarian meals, if possible.  Eat seafood 2 or more times a week.  Have healthy snacks readily available, such as: ? Vegetable sticks with hummus. ? [place] yogurt. ? Fruit and nut trail mix.  Eat balanced meals throughout the week. This includes: ? Fruit: 2-3 servings a day ? Vegetables: 4-5 servings a day ? Low-fat dairy: 2 servings a day ? Fish, poultry, or lean meat: 1 serving a day ? Beans and legumes: 2 or more servings a week ?  Nuts and seeds: 1-2 servings a day ? Whole grains: 6-8 servings a day ? Extra-virgin olive oil: 3-4 servings a day  Limit red meat and sweets to only a few servings a month What are my food choices?  Mediterranean diet ? Recommended ? Grains: Whole-grain pasta. Brown rice. Bulgar wheat. Polenta. Couscous. Whole-wheat bread. [place]. ? Vegetables: Artichokes. Beets. Broccoli. Cabbage. Carrots. Eggplant. Green beans. Chard. Kale. Spinach. Onions. Leeks. Peas. Squash.  Tomatoes. Peppers. Radishes. ? Fruits: Apples. Apricots. Avocado. Berries. Bananas. Cherries. Dates. Figs. Grapes. Lemons. Melon. Oranges. Peaches. Plums. Pomegranate. ? Meats and other protein foods: Beans. Almonds. Sunflower seeds. Pine nuts. Peanuts. Post. Salmon. Scallops. Shrimp. [place]. Tilapia. Clams. Oysters. Eggs. ? Dairy: Low-fat milk. Cheese. Greek yogurt. ? Beverages: Water. Red wine. Herbal tea. ? Fats and oils: Extra virgin olive oil. Avocado oil. Grape seed oil. ? Sweets and desserts: [place] yogurt with honey. Baked apples. Poached pears. Trail mix. ? Seasoning and other foods: Basil. Cilantro. Coriander. Cumin. Mint. Parsley. Sage. Rosemary. Tarragon. Garlic. Oregano. Thyme. Pepper. Balsalmic vinegar. Tahini. Hummus. Tomato sauce. Olives. Mushrooms. ? Limit these ? Grains: Prepackaged pasta or rice dishes. Prepackaged cereal with added sugar. ? Vegetables: Deep fried potatoes (french fries). ? Fruits: Fruit canned in syrup. ? Meats and other protein foods: Beef. Pork. Lamb. Poultry with skin. Hot dogs. Berniece Salines. ? Dairy: Ice cream. Sour cream. Whole milk. ? Beverages: Juice. Sugar-sweetened soft drinks. Beer. Liquor and spirits. ? Fats and oils: Butter. Canola oil. Vegetable oil. Beef fat (tallow). Lard. ? Sweets and desserts: Cookies. Cakes. Pies. Candy. ? Seasoning and other foods: Mayonnaise. Premade sauces and marinades. ? The items listed may not be a complete list. Talk with your dietitian about what dietary choices are right for you. Summary  The Mediterranean diet includes both food and lifestyle choices.  Eat a variety of fresh fruits and vegetables, beans, nuts, seeds, and whole grains.  Limit the amount of red meat and sweets that you eat.  Talk with your health care provider about whether it is safe for you to drink red wine in moderation. This means 1 glass a day for nonpregnant women and 2 glasses a day for men. A glass of wine equals 5 oz (150 mL). This information  is not intended to replace advice given to you by your health care provider. Make sure you discuss any questions you have with your health care provider. Document Released: 01/16/2016 Document Revised: 02/18/2016 Document Reviewed: 01/16/2016 Elsevier Interactive Patient Education  2019 [employer].   Increase water intake, strive for at least 85 ounces/day.   Follow Mediterranean diet Increase regular exercise.  Recommend at least 30 minutes daily, 5 days per week of walking, jogging, biking, swimming, YouTube/Pinterest workout videos. Referral to Pulmonology, re: Sleep Study Referral to ENT, re: Tonsillolith Follow-up in 3 months for complete physical, please schedule a fasting lab app the week prior. WELCOME TO THE PRACTICE!

## 2018-07-13 NOTE — Assessment & Plan Note (Signed)
ENT referral placed.

## 2018-07-20 ENCOUNTER — Encounter: Payer: Self-pay | Admitting: Adult Health

## 2018-07-29 DIAGNOSIS — H93293 Other abnormal auditory perceptions, bilateral: Secondary | ICD-10-CM | POA: Insufficient documentation

## 2018-08-04 ENCOUNTER — Encounter: Payer: Self-pay | Admitting: Pulmonary Disease

## 2018-08-04 ENCOUNTER — Ambulatory Visit (INDEPENDENT_AMBULATORY_CARE_PROVIDER_SITE_OTHER): Payer: BC Managed Care – PPO | Admitting: Pulmonary Disease

## 2018-08-04 DIAGNOSIS — G4733 Obstructive sleep apnea (adult) (pediatric): Secondary | ICD-10-CM | POA: Diagnosis not present

## 2018-08-04 NOTE — Patient Instructions (Signed)
Home sleep test will be scheduled. We discussed treatment options

## 2018-08-04 NOTE — Assessment & Plan Note (Signed)
Given excessive daytime somnolence, narrow pharyngeal exam, witnessed apneas & loud snoring, obstructive sleep apnea is very likely & an overnight polysomnogram will be scheduled as a home study. The pathophysiology of obstructive sleep apnea , it's cardiovascular consequences & modes of treatment including CPAP were discused with the patient in detail & they evidenced understanding.  Pretest probability is intermediate.  We discussed treatment options

## 2018-08-04 NOTE — Progress Notes (Signed)
Subjective:    Patient ID: Danielle Arellano, female    DOB: 01/10/1966, 53 y.o.   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 She underwent sleep study in 2015 and was told that she had borderline OSA but did not pursue therapy. Her husband has noted loud snoring for many years.  Her other family including mother" have further snoring and her next room.  She reports one episode 6 months ago where she woke up with her own snoring feeling like she was choking in her sleep but this has not recurred. Husband has not witnessed apneas. Epworth sleepiness score is 10 and she reports sleepiness while sitting and reading, watching TV or lying down to rest in the afternoons. She reports excessive daytime somnolence and fatigue. Bedtime is between 11 PM and midnight, sleep latency is minutes, she sleeps on her side with one pillow, reports 4-5 nocturnal awakenings including nocturia and is out of bed by 6:20 AM feeling tired with dryness of mouth but denies headaches.  On weekends she goes to bed later and will be in bed as late as 9 AM but still feels tired. She has gained about 25 pounds in the last 2 years. She drinks 1 cup of coffee in the mornings and denies excessive caffeinated beverages  There is no history suggestive of cataplexy, sleep paralysis or parasomnias  2 brothers have been diagnosed with OSA   Past Medical History:  Diagnosis Date  . Bladder mass 2016   right ureterocele on cystoscopy and CT  . Carpal tunnel syndrome   . Complication of anesthesia    itched post op-not sure what-used nubain  . Elevated hemoglobin A1c 02/14/15   HgbA1C 5.9  . Fibroid 05/08/06   TAH retained OV  . Foot mass, right ganglion 01/22/2012  . 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 No pertinent past medical history   . PONV (postoperative nausea and vomiting)    has motion sickness-used a scop patch  .  Thyromegaly    Normal thyroid US in 2014.  Marland Kitchen Ureterocele    right    Past Surgical History:  Procedure Laterality Date  . CESAREAN SECTION  04,01  . DIAGNOSTIC LAPAROSCOPY     mass rt ovary  . MASS EXCISION  01/22/2012   Procedure: EXCISION MASS;  Surgeon: Johnny Bridge, MD;  Location: [facility];  Service: Orthopedics;  Laterality: Right;  Excision mass right dorsal foot  . Myomectomies  10/99   Multiple  . TOTAL ABDOMINAL HYSTERECTOMY  2007   TAH-adhesions    No Known Allergies  Social History   Socioeconomic History  . Marital status: Married    Spouse name: Not on file  . Number of children: Not on file  . Years of education: Not on file  . Highest education level: Not on file  Occupational History  . Not on file  Social Needs  . Financial resource strain: Not on file  . Food insecurity:    Worry: Not on file    Inability: Not on file  . Transportation needs:    Medical: Not on file    Non-medical: Not on file  Tobacco Use  . Smoking status: Never Smoker  . Smokeless tobacco: Never Used  Substance and Sexual Activity  . Alcohol use: Not Currently    Alcohol/week: 0.0 standard drinks    Frequency: Never  . Drug use:  No  . Sexual activity: Yes    Partners: Male    Birth control/protection: Surgical    Comment: TAH  Lifestyle  . Physical activity:    Days per week: Not on file    Minutes per session: Not on file  . Stress: Not on file  Relationships  . Social connections:    Talks on phone: Not on file    Gets together: Not on file    Attends religious service: Not on file    Active member of club or organization: Not on file    Attends meetings of clubs or organizations: Not on file    Relationship status: Not on file  . Intimate partner violence:    Fear of current or ex partner: Not on file    Emotionally abused: Not on file    Physically abused: Not on file    Forced sexual activity: Not on file  Other Topics Concern  . Not on  file  Social History Narrative  . Not on file     Family History  Problem Relation Age of Onset  . Epilepsy Father   . Stroke Father   . Arthritis Mother   . Multiple myeloma Brother   . Cancer Brother        multiple myeloma     Review of Systems Constitutional: negative for anorexia, fevers and sweats  Eyes: negative for irritation, redness and visual disturbance  Ears, nose, mouth, throat, and face: negative for earaches, epistaxis, nasal congestion and sore throat  Respiratory: negative for cough, dyspnea on exertion, sputum and wheezing  Cardiovascular: negative for chest pain, dyspnea, lower extremity edema, orthopnea, palpitations and syncope  Gastrointestinal: negative for abdominal pain, constipation, diarrhea, melena, nausea and vomiting  Genitourinary:negative for dysuria, frequency and hematuria  Hematologic/lymphatic: negative for bleeding, easy bruising and lymphadenopathy  Musculoskeletal:negative for arthralgias, muscle weakness and stiff joints  Neurological: negative for coordination problems, gait problems, headaches and weakness  Endocrine: negative for diabetic symptoms including polydipsia, polyuria and weight loss     Objective:   Physical Exam  Gen. Pleasant, obese, in no distress, normal affect ENT - no pallor,icterus, no post nasal drip, class 2 airway Neck: No JVD, no thyromegaly, no carotid bruits Lungs: no use of accessory muscles, no dullness to percussion, decreased without rales or rhonchi  Cardiovascular: Rhythm regular, heart sounds  normal, no murmurs or gallops, no peripheral edema Abdomen: soft and non-tender, no hepatosplenomegaly, BS normal. Musculoskeletal: No deformities, no cyanosis or clubbing Neuro:  alert, non focal, no tremors       Assessment & Plan:

## 2018-08-05 NOTE — Addendum Note (Signed)
Addended by: Nena Polio on: 08/05/2018 12:08 PM   Modules accepted: Orders

## 2018-08-05 NOTE — Addendum Note (Signed)
Addended by: Nena Polio on: 08/05/2018 02:20 PM   Modules accepted: Orders

## 2018-08-09 ENCOUNTER — Other Ambulatory Visit: Payer: Self-pay

## 2018-08-09 DIAGNOSIS — G4733 Obstructive sleep apnea (adult) (pediatric): Secondary | ICD-10-CM

## 2018-08-16 ENCOUNTER — Other Ambulatory Visit: Payer: Self-pay | Admitting: Obstetrics and Gynecology

## 2018-08-16 DIAGNOSIS — Z1231 Encounter for screening mammogram for malignant neoplasm of breast: Secondary | ICD-10-CM

## 2018-09-29 ENCOUNTER — Encounter: Payer: Self-pay | Admitting: Adult Health

## 2018-09-29 ENCOUNTER — Other Ambulatory Visit: Payer: Self-pay

## 2018-09-29 ENCOUNTER — Ambulatory Visit (INDEPENDENT_AMBULATORY_CARE_PROVIDER_SITE_OTHER): Payer: BC Managed Care – PPO | Admitting: Adult Health

## 2018-09-29 ENCOUNTER — Encounter (HOSPITAL_BASED_OUTPATIENT_CLINIC_OR_DEPARTMENT_OTHER): Payer: BC Managed Care – PPO

## 2018-09-29 DIAGNOSIS — R05 Cough: Secondary | ICD-10-CM | POA: Diagnosis not present

## 2018-09-29 DIAGNOSIS — R059 Cough, unspecified: Secondary | ICD-10-CM

## 2018-09-29 DIAGNOSIS — J3489 Other specified disorders of nose and nasal sinuses: Secondary | ICD-10-CM | POA: Diagnosis not present

## 2018-09-29 MED ORDER — ALBUTEROL SULFATE HFA 108 (90 BASE) MCG/ACT IN AERS: 2.0000 | INHALATION_SPRAY | Freq: Four times a day (QID) | RESPIRATORY_TRACT | 0 refills | Status: DC | PRN

## 2018-09-29 NOTE — Assessment & Plan Note (Signed)
Assessment and Plan: Increase water intake Continue cough gtt's, use plain OTC Claritin, stop OTC Claritin-D  For discomfort- alternate OTC Acetaminophen and OTC Ibuprofen Albuterol inhaler as needed Flonase as needed (has one from previous provider) Discussed Red Flag COCID-19 sx's and instructed to call clinic if she develops any Advised to seek immediate medical help if she develops significant CP/dyspnea- pt communicated understanding and agreement  Follow Up Instructions: PRN   I discussed the assessment and treatment plan with the patient. The patient was provided an opportunity to ask questions and all were answered. The patient agreed with the plan and demonstrated an understanding of the instructions.   The patient was advised to call back or seek an in-person evaluation if the symptoms worsen or if the condition fails to improve as anticipated

## 2018-09-29 NOTE — Progress Notes (Signed)
Virtual Visit via Telephone Note  I connected with Danielle Arellano on 09/29/18 at  3:00 PM EDT by telephone and verified that I am speaking with the correct person using two identifiers.   I discussed the limitations, risks, security and privacy concerns of performing an evaluation and management service by telephone and the availability of in person appointments.The staff discussed with the patient that there may be a patient responsible charge related to this service. The patient expressed understanding and agreed to proceed.  Location of Patient- Home Location of Provider- In Clinic   History of Present Illness: Danielle Arellano calls in today with intermittent nasal pressure, intermittent non-productive sx's, and slight chest pressure that "will come and go"- sx's present for 6-7 days. Intermittent sore throat for last 4 weeks. 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 She has been using cough gtt's and a few doses of OTC Claritin   Sleep Study that was scheduled for tomorrow was cancelled due to COVID-19 pandemic Patient Care Team    Relationship Specialty Notifications Start End  Esaw Grandchild, NP PCP - General Family Medicine  07/13/18   Lavonna Monarch, MD Consulting Physician Dermatology  07/13/18   Loney Loh, MD  Dermatology  07/13/18   Nunzio Cobbs, MD Consulting Physician Obstetrics and Gynecology  07/13/18   Virgina Evener, [place] Referring Physician Optometry  07/13/18   Juanita Craver, MD Consulting Physician Gastroenterology   07/20/18     Patient Active Problem List   Diagnosis Date Noted  . OSA (obstructive sleep apnea) 08/04/2018  . Tonsillolith 07/13/2018  . Fatigue 07/13/2018  . Loud snoring 07/13/2018  . Healthcare maintenance 07/13/2018  . Laboratory exam ordered as part of routine general medical examination 02/14/2015  . Elevated hemoglobin A1c 02/14/2015  . 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 Foot mass, right ganglion 01/22/2012     Past Medical History:  Diagnosis Date  . Bladder mass 2016   right ureterocele on cystoscopy and CT  . Carpal tunnel syndrome   . Complication of anesthesia    itched post op-not sure what-used nubain  . Elevated hemoglobin A1c 02/14/15   HgbA1C 5.9  . Fibroid 05/08/06   TAH retained OV  . Foot mass, right ganglion 01/22/2012  . 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 No pertinent past medical history   . PONV (postoperative nausea and vomiting)    has motion sickness-used a scop patch  . Thyromegaly    Normal thyroid US in 2014.  Marland Kitchen Ureterocele    right     Past Surgical History:  Procedure Laterality Date  . CESAREAN SECTION  04,01  . DIAGNOSTIC LAPAROSCOPY     mass rt ovary  . MASS EXCISION  01/22/2012   Procedure: EXCISION MASS;  Surgeon: Johnny Bridge, MD;  Location: [facility];  Service: Orthopedics;  Laterality: Right;  Excision mass right dorsal foot  . Myomectomies  10/99   Multiple  . TOTAL ABDOMINAL HYSTERECTOMY  2007   TAH-adhesions     Family History  Problem Relation Age of  Onset  . Epilepsy Father   . Stroke Father   . Arthritis Mother   . Multiple myeloma Brother   . Cancer Brother        multiple myeloma     Social History   Substance and Sexual Activity  Drug Use No     Social History   Substance and Sexual Activity  Alcohol Use Not Currently  . Alcohol/week: 0.0 standard drinks  . Frequency: Never     Social History   Tobacco Use  Smoking Status Never Smoker  Smokeless Tobacco Never  Used     Outpatient Encounter Medications as of 09/29/2018  Medication Sig  . Biotin 5 MG CAPS Take 1 capsule by mouth daily.  . Multiple Vitamin (MULTI-VITAMINS PO) Take by mouth daily.  . Omega-3 Fatty Acids (FISH OIL) 1000 MG CAPS Take by mouth daily.  Marland Kitchen XIIDRA 5 % SOLN   . albuterol (VENTOLIN HFA) 108 (90 Base) MCG/ACT inhaler Inhale 2 puffs into the lungs every 6 (six) hours as needed for wheezing or shortness of breath.   No facility-administered encounter medications on file as of 09/29/2018.     Allergies: Patient has no known allergies.  There is no height or weight on file to calculate BMI.  Last menstrual period 06/08/2005.    Observations/Objective: No acute distress noted during telephone conversation  Assessment and Plan: Increase water intake Continue cough gtt's, use plain OTC Claritin, stop OTC Claritin-D  For discomfort- alternate OTC Acetaminophen and OTC Ibuprofen Albuterol inhaler as needed Flonase as needed (has one from previous provider) Discussed Red Flag COCID-19 sx's and instructed to call clinic if she develops any Advised to seek immediate medical help if she develops significant CP/dyspnea- pt communicated understanding and agreement  Follow Up Instructions: PRN   I discussed the assessment and treatment plan with the patient. The patient was provided an opportunity to ask questions and all were answered. The patient agreed with the plan and demonstrated an understanding of the instructions.   The patient was advised to call back or seek an in-person evaluation if the symptoms worsen or if the condition fails to improve as anticipated.  I provided  18 minutes of non-face-to-face time during this encounter.   Esaw Grandchild, NP

## 2018-09-30 ENCOUNTER — Ambulatory Visit: Payer: BC Managed Care – PPO

## 2018-10-12 ENCOUNTER — Ambulatory Visit: Payer: BC Managed Care – PPO | Admitting: Obstetrics and Gynecology

## 2018-10-20 ENCOUNTER — Other Ambulatory Visit: Payer: Self-pay | Admitting: Pulmonary Disease

## 2018-10-24 ENCOUNTER — Other Ambulatory Visit (HOSPITAL_COMMUNITY): Admission: RE | Admit: 2018-10-24 | Payer: BC Managed Care – PPO | Source: Ambulatory Visit

## 2018-10-24 ENCOUNTER — Other Ambulatory Visit (HOSPITAL_COMMUNITY)
Admission: RE | Admit: 2018-10-24 | Discharge: 2018-10-24 | Disposition: A | Discharge status: Home / Self Care | Payer: BC Managed Care – PPO | Source: Ambulatory Visit | Attending: Pulmonary Disease | Admitting: Pulmonary Disease

## 2018-10-24 DIAGNOSIS — Z1159 Encounter for screening for other viral diseases: Secondary | ICD-10-CM | POA: Insufficient documentation

## 2018-10-24 DIAGNOSIS — Z01812 Encounter for preprocedural laboratory examination: Secondary | ICD-10-CM | POA: Diagnosis not present

## 2018-10-25 LAB — NOVEL CORONAVIRUS, NAA (HOSP ORDER, SEND-OUT TO REF LAB; TAT 18-24 HRS): SARS-CoV-2, NAA: NOT DETECTED

## 2018-10-28 ENCOUNTER — Ambulatory Visit (HOSPITAL_BASED_OUTPATIENT_CLINIC_OR_DEPARTMENT_OTHER): Payer: BC Managed Care – PPO | Attending: Pulmonary Disease | Admitting: Pulmonary Disease

## 2018-10-28 ENCOUNTER — Other Ambulatory Visit: Payer: Self-pay

## 2018-10-28 VITALS — Ht 64.0 in | Wt 180.0 lb

## 2018-10-28 DIAGNOSIS — G4733 Obstructive sleep apnea (adult) (pediatric): Secondary | ICD-10-CM

## 2018-11-01 ENCOUNTER — Other Ambulatory Visit (HOSPITAL_BASED_OUTPATIENT_CLINIC_OR_DEPARTMENT_OTHER): Payer: Self-pay

## 2018-11-01 ENCOUNTER — Other Ambulatory Visit: Payer: Self-pay

## 2018-11-01 DIAGNOSIS — G4733 Obstructive sleep apnea (adult) (pediatric): Secondary | ICD-10-CM

## 2018-11-15 ENCOUNTER — Telehealth: Payer: Self-pay | Admitting: Pulmonary Disease

## 2018-11-15 DIAGNOSIS — G4733 Obstructive sleep apnea (adult) (pediatric): Secondary | ICD-10-CM | POA: Diagnosis not present

## 2018-11-15 NOTE — Telephone Encounter (Signed)
Patient returned phone call, made aware of results per RA. Voiced understanding. She states she will try going without it but if her symptoms became too significant she will call back. I made her aware we would be here if she felt she needed further intervention. Nothing further is needed at this time.

## 2018-11-15 NOTE — Telephone Encounter (Signed)
She had mild OSA, events were only noted during REM sleep The cardiovascular significance of mild OSA is minimal.  Treatment options include no treatment at all and managing weight,  OR oral appliance  or CPAP therapy if very symptomatic

## 2018-11-15 NOTE — Telephone Encounter (Signed)
Pt is calling back  [phone]

## 2018-11-15 NOTE — Procedures (Signed)
Patient Name: Danielle Arellano, Danielle Arellano Date: 10/28/2018 Gender: Female D.O.B: 10-25-1965 Age (years): 84 Referring Provider: Kara Mead MD, ABSM Height (inches): 64 Interpreting Physician: Kara Mead MD, ABSM Weight (lbs): 180 RPSGT: Baxter Flattery BMI: 31 MRN: [id number] Neck Size: 14.50 <br> <br>  CLINICAL INFORMATION Sleep Study Type: NPSG    Indication for sleep study: Snoring, Witnesses Apnea / Gasping During Sleep    Epworth Sleepiness Score: 13    SLEEP STUDY TECHNIQUE As per the AASM Manual for the Scoring of Sleep and Associated Events v2.3 (April 2016) with a hypopnea requiring 4% desaturations.  The channels recorded and monitored were frontal, central and occipital EEG, electrooculogram (EOG), submentalis EMG (chin), nasal and oral airflow, thoracic and abdominal wall motion, anterior tibialis EMG, snore microphone, electrocardiogram, and pulse oximetry.  MEDICATIONS Medications self-administered by patient taken the night of the study : N/A  SLEEP ARCHITECTURE The study was initiated at 10:08:54 PM and ended at 5:23:10 AM.  Sleep onset time was 55.3 minutes and the sleep efficiency was 67.6%%. The total sleep time was 293.5 minutes.  Stage REM latency was 85.0 minutes.  The patient spent 6.6%% of the night in stage N1 sleep, 76.7%% in stage N2 sleep, 0.0%% in stage N3 and 16.7% in REM.  Alpha intrusion was absent.  Supine sleep was 0.00%.  RESPIRATORY PARAMETERS The overall apnea/hypopnea index (AHI) was 9.0 per hour. There were 5 total apneas, including 5 obstructive, 0 central and 0 mixed apneas. There were 39 hypopneas and 6 RERAs.  The AHI during Stage REM sleep was 26.9 per hour.  AHI while supine was N/A per hour.  The mean oxygen saturation was 93.0%. The minimum SpO2 during sleep was 89.0%.  loud snoring was noted during this study.  CARDIAC DATA The 2 lead EKG demonstrated sinus rhythm. The mean heart rate was 65.0 beats per minute.  Other EKG findings include: None. LEG MOVEMENT DATA The total PLMS were 0 with a resulting PLMS index of 0.0. Associated arousal with leg movement index was 0.0 .  IMPRESSIONS - Mild obstructive sleep apnea occurred during this study (AHI = 9.0/h). Most events were noted during REM sleep. - No significant central sleep apnea occurred during this study (CAI = 0.0/h). - The patient had minimal or no oxygen desaturation during the study (Min O2 = 89.0%) - The patient snored with loud snoring volume. - No cardiac abnormalities were noted during this study. - Clinically significant periodic limb movements did not occur during sleep. No significant associated arousals.  DIAGNOSIS - Obstructive Sleep Apnea (327.23 [G47.33 ICD-10])  RECOMMENDATIONS - The cardiovascular significance of mild OSA is minimal.  Treatment options include no treatment, oral appliance or CPAP therapy if very symptomatic - Avoid alcohol, sedatives and other CNS depressants that may worsen sleep apnea and disrupt normal sleep architecture. - Sleep hygiene should be reviewed to assess factors that may improve sleep quality. - Weight management and regular exercise should be initiated or continued if appropriate.   Kara Mead MD Board Certified in [place]

## 2018-11-15 NOTE — Telephone Encounter (Signed)
LMTCB

## 2018-11-29 ENCOUNTER — Ambulatory Visit: Payer: BC Managed Care – PPO

## 2018-12-02 ENCOUNTER — Other Ambulatory Visit: Payer: BC Managed Care – PPO

## 2018-12-05 ENCOUNTER — Other Ambulatory Visit: Payer: BC Managed Care – PPO

## 2018-12-05 ENCOUNTER — Other Ambulatory Visit: Payer: Self-pay

## 2018-12-05 DIAGNOSIS — R5383 Other fatigue: Secondary | ICD-10-CM

## 2018-12-05 DIAGNOSIS — R7309 Other abnormal glucose: Secondary | ICD-10-CM

## 2018-12-05 DIAGNOSIS — Z0000 Encounter for general adult medical examination without abnormal findings: Secondary | ICD-10-CM

## 2018-12-05 NOTE — Progress Notes (Signed)
Subjective:    Patient ID: Danielle Arellano, female    DOB: [birth year], 53 y.o.   MRN: [id number]  HPI: 07/13/2018 OV: Danielle Arellano is here to establish as a new pt.   She is a pleasant 53 year old female. PMH: Chronic Dry eye- followed by Ophthalmologist Q9M- on several eye gtt's Tonsillolith, Loud Snoring, Daytime Fatigue, Varicose Veins She has recently starting a regular walking program - 3 days week for 57mns and plans on starting light weight lifting program March. She estimates to only drink 20-30 oz water/day, prefers to hydrate with juice and sweat tea. She denies tobacco/vape use/ETOH use She states "my husband tells me that I stop breathing at night and I bring that house down with my snoring" She underwent stem cell transplant summer 2018, to assist with her brother's treatment of Multiple Myeloma She feels that her overall health is "excellent".   12/07/2018 OV:  Danielle Arellano here for CPE She estimates to drink 40 oz water and has recently starting reducing saturated fat/sugar/CHO in diet. She recently retired from [employer] She is an avid gardener and states "this is my season", which is the bulk of her physical activity in the Spring/Summer. She denies tobacco/vape/ETOH use She reports occasional chest pressure that will self resolve after a "few moments". She denies first degree family hx of MI/CAD  10/24/2018 SARS-CoV- NAA- Not Dectected  Recent Pulm OV- Prescription for auto CPAP 5 to 12 cm with nasal pillows will be sent to DME. Call [facility] for issues  12/05/2018 Labs: TSH-WNL, 2.600  A1c-WNL, 5.5, improved from 5.8 last year  CMP-stable  CBC-stable, WBC- 3.1, hx of leukopenia  The 10-year ASCVD risk score (Mikey BussingDC Jr., et al., 2013) is: 2.8%  Values used to calculate the score:   Age: [age]years   Sex: Female   Is Non-Hispanic African American: Yes   Diabetic: No   Tobacco smoker: No   Systolic  Blood Pressure: 128 mmHg   Is BP treated: No   HDL Cholesterol: 49 mg/dL   Total Cholesterol: 167 mg/dL  LDL-104  Healthcare Maintenance: PAP-Scheduled Aug 2020 Mammogram-Scheduled 12/27/2018 Colonoscopy-UTD, 01/14/2015, last normal- repeat in 10 years Immunizations-Tdap and Zoster updated today  Patient Care Team    Relationship Specialty Notifications Start End  DEsaw Grandchild NP PCP - General Family Medicine  07/13/18   TLavonna Monarch MD Consulting Physician Dermatology  07/13/18   MLoney Loh MD  Dermatology  07/13/18   ANunzio Cobbs MD Consulting Physician Obstetrics and Gynecology  07/13/18   PVirgina Evener ONorthforkReferring Physician Optometry  07/13/18   MJuanita Craver MD Consulting Physician Gastroenterology  07/20/18     Patient Active Problem List   Diagnosis Date Noted  . Left chest pressure 12/07/2018  . Sinus pressure 09/29/2018  . Cough in adult 09/29/2018  . OSA (obstructive sleep apnea) 08/04/2018  . Tonsillolith 07/13/2018  . Fatigue 07/13/2018  . Loud snoring 07/13/2018  . Healthcare maintenance 07/13/2018  . Laboratory exam ordered as part of routine general medical examination 02/14/2015  . Elevated hemoglobin A1c 02/14/2015  . 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 Foot mass, right ganglion 01/22/2012     Past Medical History:  Diagnosis Date  . Bladder mass 2016   right ureterocele on cystoscopy and CT  . Carpal tunnel syndrome   . Complication of anesthesia    itched post op-not sure what-used nubain  . Elevated hemoglobin A1c 02/14/15  HgbA1C 5.9  . Fibroid 05/08/06   TAH retained OV  . Foot mass, right ganglion 01/22/2012  . 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 No pertinent past medical history   . PONV (postoperative nausea and vomiting)    has motion sickness-used a scop patch  . Sleep apnea   . Thyromegaly    Normal thyroid US in 2014.  Marland Kitchen Ureterocele    right     Past Surgical History:  Procedure Laterality Date   . CESAREAN SECTION  04,01  . DIAGNOSTIC LAPAROSCOPY     mass rt ovary  . MASS EXCISION  01/22/2012   Procedure: EXCISION MASS;  Surgeon: Johnny Bridge, MD;  Location: [facility];  Service: Orthopedics;  Laterality: Right;  Excision mass right dorsal foot  . Myomectomies  10/99   Multiple  . TOTAL ABDOMINAL HYSTERECTOMY  2007   TAH-adhesions     Family History  Problem Relation Age of Onset  . Epilepsy Father   . Stroke Father   . Arthritis Mother   . Multiple myeloma Brother   . Cancer Brother        multiple myeloma     Social History   Substance and Sexual Activity  Drug Use No     Social History   Substance and Sexual Activity  Alcohol Use Not Currently  . Alcohol/week: 0.0 standard drinks  . Frequency: Never     Social History   Tobacco Use  Smoking Status Never Smoker  Smokeless Tobacco Never Used     Outpatient Encounter Medications as of 12/07/2018  Medication Sig  . Biotin 5 MG CAPS Take 1 capsule by mouth daily.  . Multiple Vitamin (MULTI-VITAMINS PO) Take by mouth daily.  . Omega-3 Fatty Acids (FISH OIL) 1000 MG CAPS Take by mouth daily.  Marland Kitchen XIIDRA 5 % SOLN   . albuterol (VENTOLIN HFA) 108 (90 Base) MCG/ACT inhaler Inhale 2 puffs into the lungs every 6 (six) hours as needed for wheezing or shortness of breath.   No facility-administered encounter medications on file as of 12/07/2018.     Allergies: Patient has no known allergies.  Body mass index is 30.95 kg/m.  Blood pressure 120/75, pulse 78, temperature 99 F (37.2 C), temperature source Oral, height _0  (1.626 m), weight 180 lb 4.8 oz (81.8 kg), last menstrual period 06/08/2005, SpO2 97 %.     Review of Systems  Constitutional: Positive for fatigue. Negative for activity change, appetite change, chills, diaphoresis, fever and unexpected weight change.  HENT: Negative for congestion.   Eyes: Negative for visual disturbance.  Respiratory: Positive for chest  tightness. Negative for cough, shortness of breath, wheezing and stridor.   Cardiovascular: Negative for chest pain, palpitations and leg swelling.  Gastrointestinal: Negative for abdominal distention, anal bleeding, blood in stool, constipation, diarrhea, nausea and vomiting.  Endocrine: Negative for cold intolerance, heat intolerance, polydipsia, polyphagia and polyuria.  Genitourinary: Negative for difficulty urinating and flank pain.  Musculoskeletal: Negative for arthralgias, gait problem, joint swelling, myalgias, neck pain and neck stiffness.  Skin: Negative for color change, pallor, rash and wound.  Neurological: Negative for dizziness and headaches.  Hematological: Negative for adenopathy. Does not bruise/bleed easily.  Psychiatric/Behavioral: Negative for agitation, behavioral problems, confusion, decreased concentration, dysphoric mood, hallucinations, self-injury, sleep disturbance and suicidal ideas. The patient is not nervous/anxious and is not hyperactive.        Objective:   Physical Exam Vitals signs and nursing note reviewed.  Constitutional:  General: She is not in acute distress.    Appearance: Normal appearance. She is obese. She is not ill-appearing, toxic-appearing or diaphoretic.  HENT:     Head: Normocephalic and atraumatic.     Right Ear: Tympanic membrane, ear canal and external ear normal. There is no impacted cerumen.     Left Ear: Tympanic membrane, ear canal and external ear normal. There is no impacted cerumen.     Nose: Nose normal. No congestion.     Mouth/Throat:     Mouth: Mucous membranes are moist.     Pharynx: Oropharynx is clear. No oropharyngeal exudate.  Eyes:     Extraocular Movements: Extraocular movements intact.     Conjunctiva/sclera: Conjunctivae normal.     Pupils: Pupils are equal, round, and reactive to light.  Neck:     Musculoskeletal: Normal range of motion. No muscular tenderness.  Cardiovascular:     Rate and Rhythm: Normal  rate.     Pulses: Normal pulses.     Heart sounds: Normal heart sounds. No murmur. No friction rub. No gallop.   Pulmonary:     Effort: Pulmonary effort is normal. No respiratory distress.     Breath sounds: Normal breath sounds. No stridor. No wheezing or rales.  Chest:     Chest wall: No tenderness.  Abdominal:     General: Abdomen is protuberant. Bowel sounds are normal.     Palpations: Abdomen is soft.     Tenderness: There is no right CVA tenderness, left CVA tenderness, guarding or rebound. Negative signs include Murphy's sign, Rovsing's sign, McBurney's sign, psoas sign and obturator sign.  Musculoskeletal: Normal range of motion.        General: No swelling or tenderness.  Lymphadenopathy:     Cervical: No cervical adenopathy.  Skin:    General: Skin is warm and dry.     Capillary Refill: Capillary refill takes less than 2 seconds.  Neurological:     Mental Status: She is alert and oriented to person, place, and time.     Coordination: Coordination normal.     Deep Tendon Reflexes: Reflexes normal.  Psychiatric:        Mood and Affect: Mood normal.        Behavior: Behavior normal.        Thought Content: Thought content normal.        Judgment: Judgment normal.       Assessment & Plan:   1. Need for zoster vaccination   2. Need for Tdap vaccination   3. Healthcare maintenance   4. Left chest pressure   5. Loud snoring     Healthcare maintenance Blood pressure and labs look good. Increase water intake, strive for at least 90 ounces/day.   Follow Heart Healthy diet Increase regular exercise.  Recommend at least 30 minutes daily, 5 days per week of walking, jogging, biking, swimming, YouTube/Pinterest workout videos. EKG today is stable. If you develop more frequent chest pressure or any other cardiac symptoms please call clinic. Please follow-up with Pulmonology as directed. Follow-up here in 6 months, sooner if needed. Continue to social distance and wear a  mask when in public.  Loud snoring Recent Pulm OV- Prescription for auto CPAP 5 to 12 cm with nasal pillows will be sent to DME.   Left chest pressure EKG- NSR Poss Ant Inf, age undetermined Abnormal ECG No other tracings to compare with in system She denies CP/dyspnea/dizziness/palpitations She denies first degree family hx of MI/CAD Instructed to  call clinic if chest pressure becomes more persistent or she develops any other cardiac sx's- will send for stress test      FOLLOW-UP:  Return in about 6 months (around 06/09/2019) for Regular Follow Up.

## 2018-12-06 ENCOUNTER — Telehealth: Payer: Self-pay | Admitting: Pulmonary Disease

## 2018-12-06 LAB — COMPREHENSIVE METABOLIC PANEL
ALT: 32 IU/L (ref 0–32)
AST: 27 IU/L (ref 0–40)
Albumin/Globulin Ratio: 1.7 (ref 1.2–2.2)
Albumin: 4.3 g/dL (ref 3.8–4.9)
Alkaline Phosphatase: 76 IU/L (ref 39–117)
BUN/Creatinine Ratio: 18 (ref 9–23)
BUN: 14 mg/dL (ref 6–24)
Bilirubin Total: 0.4 mg/dL (ref 0.0–1.2)
CO2: 24 mmol/L (ref 20–29)
Calcium: 9.6 mg/dL (ref 8.7–10.2)
Chloride: 105 mmol/L (ref 96–106)
Creatinine, Ser: 0.78 mg/dL (ref 0.57–1.00)
GFR calc Af Amer: 100 mL/min/{1.73_m2} (ref >59)
GFR calc non Af Amer: 86 mL/min/{1.73_m2} (ref >59)
Globulin, Total: 2.5 g/dL (ref 1.5–4.5)
Glucose: 95 mg/dL (ref 65–99)
Potassium: 4.7 mmol/L (ref 3.5–5.2)
Sodium: 141 mmol/L (ref 134–144)
Total Protein: 6.8 g/dL (ref 6.0–8.5)

## 2018-12-06 LAB — LIPID PANEL
Chol/HDL Ratio: 3.4 ratio (ref 0.0–4.4)
Cholesterol, Total: 167 mg/dL (ref 100–199)
HDL: 49 mg/dL (ref >39)
LDL Calculated: 104 mg/dL — ABNORMAL HIGH (ref 0–99)
Triglycerides: 69 mg/dL (ref 0–149)
VLDL Cholesterol Cal: 14 mg/dL (ref 5–40)

## 2018-12-06 LAB — CBC WITH DIFFERENTIAL/PLATELET
Basophils Absolute: 0 10*3/uL (ref 0.0–0.2)
Basos: 1 %
EOS (ABSOLUTE): 0.1 10*3/uL (ref 0.0–0.4)
Eos: 2 %
Hematocrit: 41.4 % (ref 34.0–46.6)
Hemoglobin: 13.9 g/dL (ref 11.1–15.9)
Immature Grans (Abs): 0 10*3/uL (ref 0.0–0.1)
Immature Granulocytes: 0 %
Lymphocytes Absolute: 1.5 10*3/uL (ref 0.7–3.1)
Lymphs: 47 %
MCH: 29.6 pg (ref 26.6–33.0)
MCHC: 33.6 g/dL (ref 31.5–35.7)
MCV: 88 fL (ref 79–97)
Monocytes Absolute: 0.3 10*3/uL (ref 0.1–0.9)
Monocytes: 11 %
Neutrophils Absolute: 1.2 10*3/uL — ABNORMAL LOW (ref 1.4–7.0)
Neutrophils: 39 %
Platelets: 247 10*3/uL (ref 150–450)
RBC: 4.7 x10E6/uL (ref 3.77–5.28)
RDW: 13.5 % (ref 11.7–15.4)
WBC: 3.1 10*3/uL — ABNORMAL LOW (ref 3.4–10.8)

## 2018-12-06 LAB — HEMOGLOBIN A1C
Est. average glucose Bld gHb Est-mCnc: 111 mg/dL
Hgb A1c MFr Bld: 5.5 % (ref 4.8–5.6)

## 2018-12-06 LAB — TSH: TSH: 2.6 u[IU]/mL (ref 0.450–4.500)

## 2018-12-06 NOTE — Telephone Encounter (Signed)
Returned call to patient.  Patient states she continues to have difficulty sleeping.  Family states she is still snoring and restless sleep. She doesn't feel rested after sleep.  Wants to discuss options.  Appt made for 12/07/18 with Dr. Elsworth Soho.  Confirmed with patient and covid questions NEG for in office visit.  No visitor policy and temp at door reminders.  Patient acknowledged understanding.  Nothing further needed.

## 2018-12-07 ENCOUNTER — Encounter: Payer: Self-pay | Admitting: Adult Health

## 2018-12-07 ENCOUNTER — Ambulatory Visit (INDEPENDENT_AMBULATORY_CARE_PROVIDER_SITE_OTHER): Payer: BC Managed Care – PPO | Admitting: Pulmonary Disease

## 2018-12-07 ENCOUNTER — Ambulatory Visit (INDEPENDENT_AMBULATORY_CARE_PROVIDER_SITE_OTHER): Payer: BC Managed Care – PPO | Admitting: Adult Health

## 2018-12-07 ENCOUNTER — Other Ambulatory Visit: Payer: Self-pay

## 2018-12-07 ENCOUNTER — Encounter: Payer: Self-pay | Admitting: Pulmonary Disease

## 2018-12-07 VITALS — BP 120/75 | HR 78 | Temp 99.0°F | Ht 64.0 in | Wt 180.3 lb

## 2018-12-07 DIAGNOSIS — Z0000 Encounter for general adult medical examination without abnormal findings: Secondary | ICD-10-CM | POA: Diagnosis not present

## 2018-12-07 DIAGNOSIS — G4733 Obstructive sleep apnea (adult) (pediatric): Secondary | ICD-10-CM

## 2018-12-07 DIAGNOSIS — Z23 Encounter for immunization: Secondary | ICD-10-CM | POA: Diagnosis not present

## 2018-12-07 DIAGNOSIS — R0789 Other chest pain: Secondary | ICD-10-CM | POA: Diagnosis not present

## 2018-12-07 DIAGNOSIS — R0683 Snoring: Secondary | ICD-10-CM | POA: Diagnosis not present

## 2018-12-07 NOTE — Patient Instructions (Signed)
Prescription for auto CPAP 5 to 12 cm with nasal pillows will be sent to DME. Call us back for issues

## 2018-12-07 NOTE — Assessment & Plan Note (Signed)
Blood pressure and labs look good. Increase water intake, strive for at least 90 ounces/day.   Follow Heart Healthy diet Increase regular exercise.  Recommend at least 30 minutes daily, 5 days per week of walking, jogging, biking, swimming, YouTube/Pinterest workout videos. EKG today is stable. If you develop more frequent chest pressure or any other cardiac symptoms please call clinic. Please follow-up with Pulmonology as directed. Follow-up here in 6 months, sooner if needed. Continue to social distance and wear a mask when in public.

## 2018-12-07 NOTE — Assessment & Plan Note (Signed)
EKG- NSR Poss Ant Inf, age undetermined Abnormal ECG No other tracings to compare with in system She denies CP/dyspnea/dizziness/palpitations She denies first degree family hx of MI/CAD Instructed to call clinic if chest pressure becomes more persistent or she develops any other cardiac sx's- will send for stress test

## 2018-12-07 NOTE — Addendum Note (Signed)
Addended by: Collier Salina on: 12/07/2018 11:02 AM   Modules accepted: Orders

## 2018-12-07 NOTE — Progress Notes (Signed)
   Subjective:    Patient ID: Danielle Arellano, female    DOB: 02-Jan-1966, 53 y.o.   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 Follow-up    Pt here to review sleep study results. Pt doesnt have CPAP and is still having issues with sleep.     She underwent sleep study in 2015 and was told that she had borderline OSA but did not pursue therapy.  NPSG 10/2018 >> mild OSA, events were only noted during REM sleep, AHI 9/h, REM RDI 32/h  She has a retired, husband still reports loud snoring.  She has daytime tiredness.  She would like to have some treatment    Past Medical History:  Diagnosis Date  . Bladder mass 2016   right ureterocele on cystoscopy and CT  . Carpal tunnel syndrome   . Complication of anesthesia    itched post op-not sure what-used nubain  . Elevated hemoglobin A1c 02/14/15   HgbA1C 5.9  . Fibroid 05/08/06   TAH retained OV  . Foot mass, right ganglion 01/22/2012  . 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 No pertinent past medical history   . PONV (postoperative nausea and vomiting)    has motion sickness-used a scop patch  . Thyromegaly    Normal thyroid US in 2014.  Marland Kitchen Ureterocele    right     Review of Systems Patient denies significant dyspnea,cough, hemoptysis,  chest pain, palpitations, pedal edema, orthopnea, paroxysmal nocturnal dyspnea, lightheadedness, nausea, vomiting, abdominal or  leg pains      Objective:   Physical Exam   Gen. Pleasant, obese, in no distress ENT - no lesions, no post nasal drip, class 2 airway Neck: No JVD, no thyromegaly, no carotid bruits Lungs: no use of accessory muscles, no dullness to percussion, decreased without rales or rhonchi  Cardiovascular: Rhythm regular, heart sounds  normal, no murmurs or gallops, no peripheral edema Musculoskeletal: No deformities, no cyanosis or  clubbing , no tremors        Assessment & Plan:

## 2018-12-07 NOTE — Assessment & Plan Note (Signed)
Recent Pulm OV- Prescription for auto CPAP 5 to 12 cm with nasal pillows will be sent to DME.

## 2018-12-07 NOTE — Patient Instructions (Addendum)
Preventive Care for Adults, Female  A healthy lifestyle and preventive care can promote health and wellness. Preventive health guidelines for women include the following key practices.   A routine yearly physical is a good way to check with your health care provider about your health and preventive screening. It is a chance to share any concerns and updates on your health and to receive a thorough exam.   Visit your dentist for a routine exam and preventive care every 6 months. Brush your teeth twice a day and floss once a day. Good oral hygiene prevents tooth decay and gum disease.   The frequency of eye exams is based on your age, health, family medical history, use of contact lenses, and other factors. Follow your health care provider's recommendations for frequency of eye exams.   Eat a healthy diet. Foods like vegetables, fruits, whole grains, low-fat dairy products, and lean protein foods contain the nutrients you need without too many calories. Decrease your intake of foods high in solid fats, added sugars, and salt. Eat the right amount of calories for you.Get information about a proper diet from your health care provider, if necessary.   Regular physical exercise is one of the most important things you can do for your health. Most adults should get at least 150 minutes of moderate-intensity exercise (any activity that increases your heart rate and causes you to sweat) each week. In addition, most adults need muscle-strengthening exercises on 2 or more days a week.   Maintain a healthy weight. The body mass index (BMI) is a screening tool to identify possible weight problems. It provides an estimate of body fat based on height and weight. Your health care provider can find your BMI, and can help you achieve or maintain a healthy weight.For adults 20 years and older:   - A BMI below 18.5 is considered underweight.   - A BMI of 18.5 to 24.9 is normal.   - A BMI of 25 to 29.9 is  considered overweight.   - A BMI of 30 and above is considered obese.   Maintain normal blood lipids and cholesterol levels by exercising and minimizing your intake of trans and saturated fats.  Eat a balanced diet with plenty of fruit and vegetables. Blood tests for lipids and cholesterol should begin at age 20 and be repeated every 5 years minimum.  If your lipid or cholesterol levels are high, you are over 40, or you are at high risk for heart disease, you may need your cholesterol levels checked more frequently.Ongoing high lipid and cholesterol levels should be treated with medicines if diet and exercise are not working.   If you smoke, find out from your health care provider how to quit. If you do not use tobacco, do not start.   Lung cancer screening is recommended for adults aged 55-80 years who are at high risk for developing lung cancer because of a history of smoking. A yearly low-dose CT scan of the lungs is recommended for people who have at least a 30-pack-year history of smoking and are a current smoker or have quit within the past 15 years. A pack year of smoking is smoking an average of 1 pack of cigarettes a day for 1 year (for example: 1 pack a day for 30 years or 2 packs a day for 15 years). Yearly screening should continue until the smoker has stopped smoking for at least 15 years. Yearly screening should be stopped for people who develop a   health problem that would prevent them from having lung cancer treatment.   If you are pregnant, do not drink alcohol. If you are breastfeeding, be very cautious about drinking alcohol. If you are not pregnant and choose to drink alcohol, do not have more than 1 drink per day. One drink is considered to be 12 ounces (355 mL) of beer, 5 ounces (148 mL) of wine, or 1.5 ounces (44 mL) of liquor.   Avoid use of street drugs. Do not share needles with anyone. Ask for help if you need support or instructions about stopping the use of  drugs.   High blood pressure causes heart disease and increases the risk of stroke. Your blood pressure should be checked at least yearly.  Ongoing high blood pressure should be treated with medicines if weight loss and exercise do not work.   If you are 71-24 years old, ask your health care provider if you should take aspirin to prevent strokes.   Diabetes screening involves taking a blood sample to check your fasting blood sugar level. This should be done once every 3 years, after age 68, if you are within normal weight and without risk factors for diabetes. Testing should be considered at a younger age or be carried out more frequently if you are overweight and have at least 1 risk factor for diabetes.   Breast cancer screening is essential preventive care for women. You should practice "breast self-awareness."  This means understanding the normal appearance and feel of your breasts and may include breast self-examination.  Any changes detected, no matter how small, should be reported to a health care provider.  Women in their 44s and 30s should have a clinical breast exam (CBE) by a health care provider as part of a regular health exam every 1 to 3 years.  After age 12, women should have a CBE every year.  Starting at age 70, women should consider having a mammogram (breast X-ray test) every year.  Women who have a family history of breast cancer should talk to their health care provider about genetic screening.  Women at a high risk of breast cancer should talk to their health care providers about having an MRI and a mammogram every year.   -Breast cancer gene (BRCA)-related cancer risk assessment is recommended for women who have family members with BRCA-related cancers. BRCA-related cancers include breast, ovarian, tubal, and peritoneal cancers. Having family members with these cancers may be associated with an increased risk for harmful changes (mutations) in the breast cancer genes BRCA1 and  BRCA2. Results of the assessment will determine the need for genetic counseling and BRCA1 and BRCA2 testing.   The Pap test is a screening test for cervical cancer. A Pap test can show cell changes on the cervix that might become cervical cancer if left untreated. A Pap test is a procedure in which cells are obtained and examined from the lower end of the uterus (cervix).   - Women should have a Pap test starting at age 44.   - Between ages 30 and 55, Pap tests should be repeated every 2 years.   - Beginning at age 40, you should have a Pap test every 3 years as long as the past 3 Pap tests have been normal.   - Some women have medical problems that increase the chance of getting cervical cancer. Talk to your health care provider about these problems. It is especially important to talk to your health care provider if a  new problem develops soon after your last Pap test. In these cases, your health care provider may recommend more frequent screening and Pap tests.   - The above recommendations are the same for women who have or have not gotten the vaccine for human papillomavirus (HPV).   - If you had a hysterectomy for a problem that was not cancer or a condition that could lead to cancer, then you no longer need Pap tests. Even if you no longer need a Pap test, a regular exam is a good idea to make sure no other problems are starting.   - If you are between ages 18 and 55 years, and you have had normal Pap tests going back 10 years, you no longer need Pap tests. Even if you no longer need a Pap test, a regular exam is a good idea to make sure no other problems are starting.   - If you have had past treatment for cervical cancer or a condition that could lead to cancer, you need Pap tests and screening for cancer for at least 20 years after your treatment.   - If Pap tests have been discontinued, risk factors (such as a new sexual partner) need to be reassessed to determine if screening should  be resumed.   - The HPV test is an additional test that may be used for cervical cancer screening. The HPV test looks for the virus that can cause the cell changes on the cervix. The cells collected during the Pap test can be tested for HPV. The HPV test could be used to screen women aged 67 years and older, and should be used in women of any age who have unclear Pap test results. After the age of 57, women should have HPV testing at the same frequency as a Pap test.   Colorectal cancer can be detected and often prevented. Most routine colorectal cancer screening begins at the age of 42 years and continues through age 60 years. However, your health care provider may recommend screening at an earlier age if you have risk factors for colon cancer. On a yearly basis, your health care provider may provide home test kits to check for hidden blood in the stool.  Use of a small camera at the end of a tube, to directly examine the colon (sigmoidoscopy or colonoscopy), can detect the earliest forms of colorectal cancer. Talk to your health care provider about this at age 78, when routine screening begins. Direct exam of the colon should be repeated every 5 -10 years through age 54 years, unless early forms of pre-cancerous polyps or small growths are found.   People who are at an increased risk for hepatitis B should be screened for this virus. You are considered at high risk for hepatitis B if:  -You were born in a country where hepatitis B occurs often. Talk with your health care provider about which countries are considered high risk.  - Your parents were born in a high-risk country and you have not received a shot to protect against hepatitis B (hepatitis B vaccine).  - You have HIV or AIDS.  - You use needles to inject street drugs.  - You live with, or have sex with, someone who has Hepatitis B.  - You get hemodialysis treatment.  - You take certain medicines for conditions like cancer, organ  transplantation, and autoimmune conditions.   Hepatitis C blood testing is recommended for all people born from [birth year] through 1965 and any individual  with known risks for hepatitis C.   Practice safe sex. Use condoms and avoid high-risk sexual practices to reduce the spread of sexually transmitted infections (STIs). STIs include gonorrhea, chlamydia, syphilis, trichomonas, herpes, HPV, and human immunodeficiency virus (HIV). Herpes, HIV, and HPV are viral illnesses that have no cure. They can result in disability, cancer, and death. Sexually active women aged 64 years and younger should be checked for chlamydia. Older women with new or multiple partners should also be tested for chlamydia. Testing for other STIs is recommended if you are sexually active and at increased risk.   Osteoporosis is a disease in which the bones lose minerals and strength with aging. This can result in serious bone fractures or breaks. The risk of osteoporosis can be identified using a bone density scan. Women ages 60 years and over and women at risk for fractures or osteoporosis should discuss screening with their health care providers. Ask your health care provider whether you should take a calcium supplement or vitamin D to There are also several preventive steps women can take to avoid osteoporosis and resulting fractures or to keep osteoporosis from worsening. -->Recommendations include:  Eat a balanced diet high in fruits, vegetables, calcium, and vitamins.  Get enough calcium. The recommended total intake of is 1,200 mg daily; for best absorption, if taking supplements, divide doses into 250-500 mg doses throughout the day. Of the two types of calcium, calcium carbonate is best absorbed when taken with food but calcium citrate can be taken on an empty stomach.  Get enough vitamin D. [employer] and the [employer] recommend at least 1,000 IU per day for women age 8 and over who are at risk of vitamin D  deficiency. Vitamin D deficiency can be caused by inadequate sun exposure (for example, those who live in [place]).  Avoid alcohol and smoking. Heavy alcohol intake (more than 7 drinks per week) increases the risk of falls and hip fracture and women smokers tend to lose bone more rapidly and have lower bone mass than nonsmokers. Stopping smoking is one of the most important changes women can make to improve their health and decrease risk for disease.  Be physically active every day. Weight-bearing exercise (for example, fast walking, hiking, jogging, and weight training) may strengthen bones or slow the rate of bone loss that comes with aging. Balancing and muscle-strengthening exercises can reduce the risk of falling and fracture.  Consider therapeutic medications. Currently, several types of effective drugs are available. Healthcare providers can recommend the type most appropriate for each woman.  Eliminate environmental factors that may contribute to accidents. Falls cause nearly 90% of all osteoporotic fractures, so reducing this risk is an important bone-health strategy. Measures include ample lighting, removing obstructions to walking, using nonskid rugs on floors, and placing mats and/or grab bars in showers.  Be aware of medication side effects. Some common medicines make bones weaker. These include a type of steroid drug called glucocorticoids used for arthritis and asthma, some antiseizure drugs, certain sleeping pills, treatments for endometriosis, and some cancer drugs. An overactive thyroid gland or using too much thyroid hormone for an underactive thyroid can also be a problem. If you are taking these medicines, talk to your doctor about what you can do to help protect your bones.reduce the rate of osteoporosis.    Menopause can be associated with physical symptoms and risks. Hormone replacement therapy is available to decrease symptoms and risks. You should talk to your  health care provider  about whether hormone replacement therapy is right for you.   Use sunscreen. Apply sunscreen liberally and repeatedly throughout the day. You should seek shade when your shadow is shorter than you. Protect yourself by wearing long sleeves, pants, a wide-brimmed hat, and sunglasses year round, whenever you are outdoors.   Once a month, do a whole body skin exam, using a mirror to look at the skin on your back. Tell your health care provider of new moles, moles that have irregular borders, moles that are larger than a pencil eraser, or moles that have changed in shape or color.   -Stay current with required vaccines (immunizations).   Influenza vaccine. All adults should be immunized every year.  Tetanus, diphtheria, and acellular pertussis (Td, Tdap) vaccine. Pregnant women should receive 1 dose of Tdap vaccine during each pregnancy. The dose should be obtained regardless of the length of time since the last dose. Immunization is preferred during the 27th 36th week of gestation. An adult who has not previously received Tdap or who does not know her vaccine status should receive 1 dose of Tdap. This initial dose should be followed by tetanus and diphtheria toxoids (Td) booster doses every 10 years. Adults with an unknown or incomplete history of completing a 3-dose immunization series with Td-containing vaccines should begin or complete a primary immunization series including a Tdap dose. Adults should receive a Td booster every 10 years.  Varicella vaccine. An adult without evidence of immunity to varicella should receive 2 doses or a second dose if she has previously received 1 dose. Pregnant females who do not have evidence of immunity should receive the first dose after pregnancy. This first dose should be obtained before leaving the health care facility. The second dose should be obtained 4 8 weeks after the first dose.  Human papillomavirus (HPV) vaccine. Females aged 13 26  years who have not received the vaccine previously should obtain the 3-dose series. The vaccine is not recommended for use in pregnant females. However, pregnancy testing is not needed before receiving a dose. If a female is found to be pregnant after receiving a dose, no treatment is needed. In that case, the remaining doses should be delayed until after the pregnancy. Immunization is recommended for any person with an immunocompromised condition through the age of 26 years if she did not get any or all doses earlier. During the 3-dose series, the second dose should be obtained 4 8 weeks after the first dose. The third dose should be obtained 24 weeks after the first dose and 16 weeks after the second dose.  Zoster vaccine. One dose is recommended for adults aged 60 years or older unless certain conditions are present.  Measles, mumps, and rubella (MMR) vaccine. Adults born before 1957 generally are considered immune to measles and mumps. Adults born in 1957 or later should have 1 or more doses of MMR vaccine unless there is a contraindication to the vaccine or there is laboratory evidence of immunity to each of the three diseases. A routine second dose of MMR vaccine should be obtained at least 28 days after the first dose for students attending postsecondary schools, health care workers, or international travelers. People who received inactivated measles vaccine or an unknown type of measles vaccine during 1963 1967 should receive 2 doses of MMR vaccine. People who received inactivated mumps vaccine or an unknown type of mumps vaccine before 1979 and are at high risk for mumps infection should consider immunization with 2 doses of   MMR vaccine. For females of childbearing age, rubella immunity should be determined. If there is no evidence of immunity, females who are not pregnant should be vaccinated. If there is no evidence of immunity, females who are pregnant should delay immunization until after pregnancy.  Unvaccinated health care workers born before [birth year] who lack laboratory evidence of measles, mumps, or rubella immunity or laboratory confirmation of disease should consider measles and mumps immunization with 2 doses of MMR vaccine or rubella immunization with 1 dose of MMR vaccine.  Pneumococcal 13-valent conjugate (PCV13) vaccine. When indicated, a person who is uncertain of her immunization history and has no record of immunization should receive the PCV13 vaccine. An adult aged 54 years or older who has certain medical conditions and has not been previously immunized should receive 1 dose of PCV13 vaccine. This PCV13 should be followed with a dose of pneumococcal polysaccharide (PPSV23) vaccine. The PPSV23 vaccine dose should be obtained at least 8 weeks after the dose of PCV13 vaccine. An adult aged 58 years or older who has certain medical conditions and previously received 1 or more doses of PPSV23 vaccine should receive 1 dose of PCV13. The PCV13 vaccine dose should be obtained 1 or more years after the last PPSV23 vaccine dose.  Pneumococcal polysaccharide (PPSV23) vaccine. When PCV13 is also indicated, PCV13 should be obtained first. All adults aged 58 years and older should be immunized. An adult younger than age 65 years who has certain medical conditions should be immunized. Any person who resides in a nursing home or long-term care facility should be immunized. An adult smoker should be immunized. People with an immunocompromised condition and certain other conditions should receive both PCV13 and PPSV23 vaccines. People with human immunodeficiency virus (HIV) infection should be immunized as soon as possible after diagnosis. Immunization during chemotherapy or radiation therapy should be avoided. Routine use of PPSV23 vaccine is not recommended for American Indians, [place] Natives, or people younger than 65 years unless there are medical conditions that require PPSV23 vaccine. When indicated,  people who have unknown immunization and have no record of immunization should receive PPSV23 vaccine. One-time revaccination 5 years after the first dose of PPSV23 is recommended for people aged 70 64 years who have chronic kidney failure, nephrotic syndrome, asplenia, or immunocompromised conditions. People who received 1 2 doses of PPSV23 before age 32 years should receive another dose of PPSV23 vaccine at age 96 years or later if at least 5 years have passed since the previous dose. Doses of PPSV23 are not needed for people immunized with PPSV23 at or after age 55 years.  Meningococcal vaccine. Adults with asplenia or persistent complement component deficiencies should receive 2 doses of quadrivalent meningococcal conjugate (MenACWY-D) vaccine. The doses should be obtained at least 2 months apart. Microbiologists working with certain meningococcal bacteria, [employer] recruits, people at risk during an outbreak, and people who travel to or live in countries with a high rate of meningitis should be immunized. A first-year college student up through age 58 years who is living in a residence hall should receive a dose if she did not receive a dose on or after her 16th birthday. Adults who have certain high-risk conditions should receive one or more doses of vaccine.  Hepatitis A vaccine. Adults who wish to be protected from this disease, have certain high-risk conditions, work with hepatitis A-infected animals, work in hepatitis A research labs, or travel to or work in countries with a high rate of hepatitis A should be  immunized. Adults who were previously unvaccinated and who anticipate close contact with an international adoptee during the first 60 days after arrival in the [place] from a country with a high rate of hepatitis A should be immunized.  Hepatitis B vaccine.  Adults who wish to be protected from this disease, have certain high-risk conditions, may be exposed to blood or other infectious  body fluids, are household contacts or sex partners of hepatitis B positive people, are clients or workers in certain care facilities, or travel to or work in countries with a high rate of hepatitis B should be immunized.  Haemophilus influenzae type b (Hib) vaccine. A previously unvaccinated person with asplenia or sickle cell disease or having a scheduled splenectomy should receive 1 dose of Hib vaccine. Regardless of previous immunization, a recipient of a hematopoietic stem cell transplant should receive a 3-dose series 6 12 months after her successful transplant. Hib vaccine is not recommended for adults with HIV infection.  Preventive Services / Frequency Ages 6 to 39years  Blood pressure check.** / Every 1 to 2 years.  Lipid and cholesterol check.** / Every 5 years beginning at age 39.  Clinical breast exam.** / Every 3 years for women in their 61s and 62s.  BRCA-related cancer risk assessment.** / For women who have family members with a BRCA-related cancer (breast, ovarian, tubal, or peritoneal cancers).  Pap test.** / Every 2 years from ages 47 through 85. Every 3 years starting at age 34 through age 12 or 74 with a history of 3 consecutive normal Pap tests.  HPV screening.** / Every 3 years from ages 46 through ages 43 to 54 with a history of 3 consecutive normal Pap tests.  Hepatitis C blood test.** / For any individual with known risks for hepatitis C.  Skin self-exam. / Monthly.  Influenza vaccine. / Every year.  Tetanus, diphtheria, and acellular pertussis (Tdap, Td) vaccine.** / Consult your health care provider. Pregnant women should receive 1 dose of Tdap vaccine during each pregnancy. 1 dose of Td every 10 years.  Varicella vaccine.** / Consult your health care provider. Pregnant females who do not have evidence of immunity should receive the first dose after pregnancy.  HPV vaccine. / 3 doses over 6 months, if 64 and younger. The vaccine is not recommended for use in  pregnant females. However, pregnancy testing is not needed before receiving a dose.  Measles, mumps, rubella (MMR) vaccine.** / You need at least 1 dose of MMR if you were born in 1957 or later. You may also need a 2nd dose. For females of childbearing age, rubella immunity should be determined. If there is no evidence of immunity, females who are not pregnant should be vaccinated. If there is no evidence of immunity, females who are pregnant should delay immunization until after pregnancy.  Pneumococcal 13-valent conjugate (PCV13) vaccine.** / Consult your health care provider.  Pneumococcal polysaccharide (PPSV23) vaccine.** / 1 to 2 doses if you smoke cigarettes or if you have certain conditions.  Meningococcal vaccine.** / 1 dose if you are age 71 to 37 years and a [employer] researcher living in a residence hall, or have one of several medical conditions, you need to get vaccinated against meningococcal disease. You may also need additional booster doses.  Hepatitis A vaccine.** / Consult your health care provider.  Hepatitis B vaccine.** / Consult your health care provider.  Haemophilus influenzae type b (Hib) vaccine.** / Consult your health care provider.  Ages 55 to 64years  Blood pressure check.** / Every 1 to 2 years.  Lipid and cholesterol check.** / Every 5 years beginning at age 91 years.  Lung cancer screening. / Every year if you are aged 69 80 years and have a 30-pack-year history of smoking and currently smoke or have quit within the past 15 years. Yearly screening is stopped once you have quit smoking for at least 15 years or develop a health problem that would prevent you from having lung cancer treatment.  Clinical breast exam.** / Every year after age 55 years.  BRCA-related cancer risk assessment.** / For women who have family members with a BRCA-related cancer (breast, ovarian, tubal, or peritoneal cancers).  Mammogram.** / Every year beginning at age 85  years and continuing for as long as you are in good health. Consult with your health care provider.  Pap test.** / Every 3 years starting at age 16 years through age 8 or 53 years with a history of 3 consecutive normal Pap tests.  HPV screening.** / Every 3 years from ages 53 years through ages 65 to 84 years with a history of 3 consecutive normal Pap tests.  Fecal occult blood test (FOBT) of stool. / Every year beginning at age 71 years and continuing until age 60 years. You may not need to do this test if you get a colonoscopy every 10 years.  Flexible sigmoidoscopy or colonoscopy.** / Every 5 years for a flexible sigmoidoscopy or every 10 years for a colonoscopy beginning at age 47 years and continuing until age 28 years.  Hepatitis C blood test.** / For all people born from [birth year] through 1967 and any individual with known risks for hepatitis C.  Skin self-exam. / Monthly.  Influenza vaccine. / Every year.  Tetanus, diphtheria, and acellular pertussis (Tdap/Td) vaccine.** / Consult your health care provider. Pregnant women should receive 1 dose of Tdap vaccine during each pregnancy. 1 dose of Td every 10 years.  Varicella vaccine.** / Consult your health care provider. Pregnant females who do not have evidence of immunity should receive the first dose after pregnancy.  Zoster vaccine.** / 1 dose for adults aged 65 years or older.  Measles, mumps, rubella (MMR) vaccine.** / You need at least 1 dose of MMR if you were born in 1957 or later. You may also need a 2nd dose. For females of childbearing age, rubella immunity should be determined. If there is no evidence of immunity, females who are not pregnant should be vaccinated. If there is no evidence of immunity, females who are pregnant should delay immunization until after pregnancy.  Pneumococcal 13-valent conjugate (PCV13) vaccine.** / Consult your health care provider.  Pneumococcal polysaccharide (PPSV23) vaccine.** / 1 to 2 doses if  you smoke cigarettes or if you have certain conditions.  Meningococcal vaccine.** / Consult your health care provider.  Hepatitis A vaccine.** / Consult your health care provider.  Hepatitis B vaccine.** / Consult your health care provider.  Haemophilus influenzae type b (Hib) vaccine.** / Consult your health care provider.  Ages 18 years and over  Blood pressure check.** / Every 1 to 2 years.  Lipid and cholesterol check.** / Every 5 years beginning at age 75 years.  Lung cancer screening. / Every year if you are aged 54 80 years and have a 30-pack-year history of smoking and currently smoke or have quit within the past 15 years. Yearly screening is stopped once you have quit smoking for at least 15 years or develop a health problem that  would prevent you from having lung cancer treatment.  Clinical breast exam.** / Every year after age 103 years.  BRCA-related cancer risk assessment.** / For women who have family members with a BRCA-related cancer (breast, ovarian, tubal, or peritoneal cancers).  Mammogram.** / Every year beginning at age 36 years and continuing for as long as you are in good health. Consult with your health care provider.  Pap test.** / Every 3 years starting at age 5 years through age 85 or 10 years with 3 consecutive normal Pap tests. Testing can be stopped between 65 and 70 years with 3 consecutive normal Pap tests and no abnormal Pap or HPV tests in the past 10 years.  HPV screening.** / Every 3 years from ages 93 years through ages 70 or 45 years with a history of 3 consecutive normal Pap tests. Testing can be stopped between 65 and 70 years with 3 consecutive normal Pap tests and no abnormal Pap or HPV tests in the past 10 years.  Fecal occult blood test (FOBT) of stool. / Every year beginning at age 8 years and continuing until age 45 years. You may not need to do this test if you get a colonoscopy every 10 years.  Flexible sigmoidoscopy or colonoscopy.** /  Every 5 years for a flexible sigmoidoscopy or every 10 years for a colonoscopy beginning at age 69 years and continuing until age 68 years.  Hepatitis C blood test.** / For all people born from [birth year] through 1967 and any individual with known risks for hepatitis C.  Osteoporosis screening.** / A one-time screening for women ages 7 years and over and women at risk for fractures or osteoporosis.  Skin self-exam. / Monthly.  Influenza vaccine. / Every year.  Tetanus, diphtheria, and acellular pertussis (Tdap/Td) vaccine.** / 1 dose of Td every 10 years.  Varicella vaccine.** / Consult your health care provider.  Zoster vaccine.** / 1 dose for adults aged 5 years or older.  Pneumococcal 13-valent conjugate (PCV13) vaccine.** / Consult your health care provider.  Pneumococcal polysaccharide (PPSV23) vaccine.** / 1 dose for all adults aged 74 years and older.  Meningococcal vaccine.** / Consult your health care provider.  Hepatitis A vaccine.** / Consult your health care provider.  Hepatitis B vaccine.** / Consult your health care provider.  Haemophilus influenzae type b (Hib) vaccine.** / Consult your health care provider. ** Family history and personal history of risk and conditions may change your health care provider's recommendations. Document Released: 07/21/2001 Document Revised: 03/15/2013  [facility] Patient Information 2014 [place], [place].   EXERCISE AND DIET:  We recommended that you start or continue a regular exercise program for good health. Regular exercise means any activity that makes your heart beat faster and makes you sweat.  We recommend exercising at least 30 minutes per day at least 3 days a week, preferably 5.  We also recommend a diet low in fat and sugar / carbohydrates.  Inactivity, poor dietary choices and obesity can cause diabetes, heart attack, stroke, and kidney damage, among others.     ALCOHOL AND SMOKING:  Women should limit their alcohol intake to no  more than 7 drinks/beers/glasses of wine (combined, not each!) per week. Moderation of alcohol intake to this level decreases your risk of breast cancer and liver damage.  ( And of course, no recreational drugs are part of a healthy lifestyle.)  Also, you should not be smoking at all or even being exposed to second hand smoke. Most people know smoking can  cause cancer, and various heart and lung diseases, but did you know it also contributes to weakening of your bones?  Aging of your skin?  Yellowing of your teeth and nails?   CALCIUM AND VITAMIN D:  Adequate intake of calcium and Vitamin D are recommended.  The recommendations for exact amounts of these supplements seem to change often, but generally speaking 600 mg of calcium (either carbonate or citrate) and 800 units of Vitamin D per day seems prudent. Certain women may benefit from higher intake of Vitamin D.  If you are among these women, your doctor will have told you during your visit.     PAP SMEARS:  Pap smears, to check for cervical cancer or precancers,  have traditionally been done yearly, although recent scientific advances have shown that most women can have pap smears less often.  However, every woman still should have a physical exam from her gynecologist or primary care physician every year. It will include a breast check, inspection of the vulva and vagina to check for abnormal growths or skin changes, a visual exam of the cervix, and then an exam to evaluate the size and shape of the uterus and ovaries.  And after 55 years of age, a rectal exam is indicated to check for rectal cancers. We will also provide age appropriate advice regarding health maintenance, like when you should have certain vaccines, screening for sexually transmitted diseases, bone density testing, colonoscopy, mammograms, etc.    MAMMOGRAMS:  All women over 50 years old should have a yearly mammogram. Many facilities now offer a "3D" mammogram, which may cost  around $50 extra out of pocket. If possible,  we recommend you accept the option to have the 3D mammogram performed.  It both reduces the number of women who will be called back for extra views which then turn out to be normal, and it is better than the routine mammogram at detecting truly abnormal areas.     COLONOSCOPY:  Colonoscopy to screen for colon cancer is recommended for all women at age 64.  We know, you hate the idea of the prep.  We agree, BUT, having colon cancer and not knowing it is worse!!  Colon cancer so often starts as a polyp that can be seen and removed at colonscopy, which can quite literally save your life!  And if your first colonoscopy is normal and you have no family history of colon cancer, most women don't have to have it again for 10 years.  Once every ten years, you can do something that may end up saving your life, right?  We will be happy to help you get it scheduled when you are ready.  Be sure to check your insurance coverage so you understand how much it will cost.  It may be covered as a preventative service at no cost, but you should check your particular policy.   Blood pressure and labs look good. Increase water intake, strive for at least 90 ounces/day.   Follow Heart Healthy diet Increase regular exercise.  Recommend at least 30 minutes daily, 5 days per week of walking, jogging, biking, swimming, YouTube/Pinterest workout videos. EKG today is stable. If you develop more frequent chest pressure or any other cardiac symptoms please call clinic. Please follow-up with Pulmonology as directed. Follow-up here in 6 months, sooner if needed. Continue to social distance and wear a mask when in public. CONGRATULATIONS ON YOUR RETIREMENT and HAPPY EARLY BIRTHDAY! GREAT TO SEE YOU!

## 2018-12-07 NOTE — Assessment & Plan Note (Signed)
She has REM predominant mild OSA but is very symptomatic with loud snoring and daytime fatigue Prescription for auto CPAP 5 to 12 cm with nasal pillows will be sent to DME. Call us back for issues  Expect improvement in daytime somnolence and snoring Weight loss encouraged, compliance with goal of at least 4-6 hrs every night is the expectation. Advised against medications with sedative side effects Cautioned against driving when sleepy - understanding that sleepiness will vary on a day to day basis

## 2018-12-12 NOTE — Addendum Note (Signed)
Addended by: Fonnie Mu on: 12/12/2018 01:15 PM   Modules accepted: Orders

## 2018-12-27 ENCOUNTER — Ambulatory Visit
Admission: RE | Admit: 2018-12-27 | Discharge: 2018-12-27 | Disposition: A | Discharge status: Home / Self Care | Payer: BC Managed Care – PPO | Source: Ambulatory Visit | Attending: Obstetrics and Gynecology | Admitting: Obstetrics and Gynecology

## 2018-12-27 ENCOUNTER — Other Ambulatory Visit: Payer: Self-pay

## 2018-12-27 DIAGNOSIS — Z1231 Encounter for screening mammogram for malignant neoplasm of breast: Secondary | ICD-10-CM

## 2019-01-10 ENCOUNTER — Other Ambulatory Visit: Payer: Self-pay

## 2019-01-10 DIAGNOSIS — Z20822 Contact with and (suspected) exposure to covid-19: Secondary | ICD-10-CM

## 2019-01-11 LAB — NOVEL CORONAVIRUS, NAA: SARS-CoV-2, NAA: NOT DETECTED

## 2019-01-30 ENCOUNTER — Other Ambulatory Visit: Payer: Self-pay | Admitting: Adult Health

## 2019-01-31 ENCOUNTER — Telehealth: Payer: Self-pay | Admitting: Adult Health

## 2019-01-31 DIAGNOSIS — R0789 Other chest pain: Secondary | ICD-10-CM

## 2019-01-31 NOTE — Telephone Encounter (Signed)
[place] called [place] & [place] now observing the 14 dy quarantine, but states she had/ has been experiencing some chest discomfort even though @ last OV w/ PCP the EKG was fine she still wishes to be evaluated by a Cardiologist.   --Forwarding request to medical asst for review .  --glh

## 2019-01-31 NOTE — Telephone Encounter (Signed)
Referral placed.  LVM for pt informing her of referral.  Charyl Bigger, CMA

## 2019-01-31 NOTE — Telephone Encounter (Signed)
Ok to placed referral to cards?  Charyl Bigger, CMA

## 2019-01-31 NOTE — Telephone Encounter (Signed)
Place referral  Thanks! Danielle Arellano

## 2019-02-06 DIAGNOSIS — M546 Pain in thoracic spine: Secondary | ICD-10-CM | POA: Insufficient documentation

## 2019-02-07 ENCOUNTER — Ambulatory Visit: Payer: BC Managed Care – PPO | Admitting: Adult Health

## 2019-02-07 ENCOUNTER — Ambulatory Visit: Payer: BC Managed Care – PPO

## 2019-02-08 ENCOUNTER — Telehealth: Payer: Self-pay | Admitting: Adult Health

## 2019-02-08 NOTE — Telephone Encounter (Signed)
Please f/u on referral appt.  Charyl Bigger, CMA

## 2019-02-08 NOTE — Telephone Encounter (Signed)
Patient called states no call from [place] nor Cardio office regarding referral request.  --Forwarding message to medical assistant.  -glh

## 2019-02-08 NOTE — Telephone Encounter (Signed)
Danielle Arellano, Please follow up this referral.  Charyl Bigger, CMA

## 2019-02-16 ENCOUNTER — Ambulatory Visit (INDEPENDENT_AMBULATORY_CARE_PROVIDER_SITE_OTHER): Payer: BC Managed Care – PPO | Admitting: Adult Health

## 2019-02-16 ENCOUNTER — Encounter: Payer: Self-pay | Admitting: Adult Health

## 2019-02-16 ENCOUNTER — Other Ambulatory Visit: Payer: Self-pay

## 2019-02-16 DIAGNOSIS — G4733 Obstructive sleep apnea (adult) (pediatric): Secondary | ICD-10-CM | POA: Diagnosis not present

## 2019-02-16 NOTE — Progress Notes (Signed)
@Patient  ID: Danielle Arellano, female    DOB: [birth year], 53 y.o.   MRN: [id number]  Chief Complaint  Patient presents with  . Follow-up    OSA     Referring provider: Esaw Grandchild, NP  HPI: 53 year old female followed for obstructive sleep apnea  TEST/EVENTS :  NPSG 10/2018 >> mild OSA, events were only noted during REM sleep, AHI 9/h, REM RDI 32/h  02/16/2019 Follow up : OSA  Patient returns for a 18-month follow-up.  Patient is recently been diagnosed with mild sleep apnea.  She has started on nocturnal CPAP.  Patient says she has tried to wear it several times and when she does wear it it has worked very well with decreased daytime sleepiness.  She has been on 2 separate trips that she did not take her CPAP with her and she also has recently had some neck issues and has not been able to sleep very well.  CPAP download shows 60% compliance with daily average usage at 4.5 hours.  Patient is on auto CPAP 5 to 12 cm H2O.  AHI 2.3.    No Known Allergies  Immunization History  Administered Date(s) Administered  . Influenza-Unspecified 03/08/2018  . Tdap 12/07/2018  . Zoster Recombinat (Shingrix) 12/07/2018    Past Medical History:  Diagnosis Date  . Bladder mass 2016   right ureterocele on cystoscopy and CT  . Carpal tunnel syndrome   . Complication of anesthesia    itched post op-not sure what-used nubain  . Elevated hemoglobin A1c 02/14/15   HgbA1C 5.9  . Fibroid 05/08/06   TAH retained OV  . Foot mass, right ganglion 01/22/2012  . 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 No pertinent past medical history   . PONV (postoperative nausea and vomiting)    has motion sickness-used a scop patch  . Sleep apnea   . Thyromegaly    Normal thyroid US in 2014.  Marland Kitchen Ureterocele    right    Tobacco History: Social History   Tobacco Use  Smoking Status Never Smoker  Smokeless Tobacco Never Used   Counseling given: Not Answered   Outpatient Medications Prior to  Visit  Medication Sig Dispense Refill  . albuterol (VENTOLIN HFA) 108 (90 Base) MCG/ACT inhaler TAKE 2 PUFFS BY MOUTH EVERY 6 HOURS AS NEEDED FOR WHEEZE OR SHORTNESS OF BREATH 18 g 0  . Biotin 5 MG CAPS Take 1 capsule by mouth daily.    Marland Kitchen gabapentin (NEURONTIN) 300 MG capsule Take 300 mg by mouth 3 (three) times daily.    Marland Kitchen HYDROcodone-acetaminophen (NORCO/VICODIN) 5-325 MG tablet Take 1 tablet by mouth every 6 (six) hours as needed for moderate pain.    . Multiple Vitamin (MULTI-VITAMINS PO) Take by mouth daily.    . Omega-3 Fatty Acids (FISH OIL) 1000 MG CAPS Take by mouth daily.    Marland Kitchen XIIDRA 5 % SOLN      No facility-administered medications prior to visit.      Review of Systems:   Constitutional:   No  weight loss, night sweats,  Fevers, chills, fatigue, or  lassitude.  HEENT:   No headaches,  Difficulty swallowing,  Tooth/dental problems, or  Sore throat,                No sneezing, itching, ear ache, nasal congestion, post nasal drip,   CV:  No chest pain,  Orthopnea, PND, swelling in lower extremities, anasarca, dizziness, palpitations, syncope.   GI  No  heartburn, indigestion, abdominal pain, nausea, vomiting, diarrhea, change in bowel habits, loss of appetite, bloody stools.   Resp: No shortness of breath with exertion or at rest.  No excess mucus, no productive cough,  No non-productive cough,  No coughing up of blood.  No change in color of mucus.  No wheezing.  No chest wall deformity  Skin: no rash or lesions.  GU: no dysuria, change in color of urine, no urgency or frequency.  No flank pain, no hematuria   MS:  No joint pain or swelling.  No decreased range of motion.  No back pain.    Physical Exam  BP 112/64 (BP Location: Left Arm, Cuff Size: Large)   Pulse 74   Temp (!) 97.3 F (36.3 C) (Temporal)   Ht 5\' 5"  (1.651 m)   Wt 175 lb (79.4 kg)   LMP 06/08/2005 (Approximate)   SpO2 98%   BMI 29.12 kg/m   GEN: A/Ox3; pleasant , NAD, well nourished     HEENT:  [place]/AT,  , NOSE-clear, THROAT-clear, no lesions, no postnasal drip or exudate noted. Class 2- MP airway   NECK:  Supple w/ fair ROM; no JVD; normal carotid impulses w/o bruits; no thyromegaly or nodules palpated; no lymphadenopathy.    RESP  Clear  P & A; w/o, wheezes/ rales/ or rhonchi. no accessory muscle use, no dullness to percussion  CARD:  RRR, no m/r/g, no peripheral edema, pulses intact, no cyanosis or clubbing.  GI:   Soft & nt; nml bowel sounds; no organomegaly or masses detected.   Musco: Warm bil, no deformities or joint swelling noted.   Neuro: alert, no focal deficits noted.    Skin: Warm, no lesions or rashes    Lab Results:  CBC    Component Value Date/Time   WBC 3.1 (L) 12/05/2018 0949   WBC 3.3 (L) 10/02/2016 1452   RBC 4.70 12/05/2018 0949   RBC 4.35 10/02/2016 1452   HGB 13.9 12/05/2018 0949   HGB 12.3 09/26/2014 1339   HCT 41.4 12/05/2018 0949   PLT 247 12/05/2018 0949   MCV 88 12/05/2018 0949   MCH 29.6 12/05/2018 0949   MCH 28.3 10/02/2016 1452   MCHC 33.6 12/05/2018 0949   MCHC 32.4 10/02/2016 1452   RDW 13.5 12/05/2018 0949   LYMPHSABS 1.5 12/05/2018 0949   EOSABS 0.1 12/05/2018 0949   BASOSABS 0.0 12/05/2018 0949    BMET    Component Value Date/Time   NA 141 12/05/2018 0949   K 4.7 12/05/2018 0949   CL 105 12/05/2018 0949   CO2 24 12/05/2018 0949   GLUCOSE 95 12/05/2018 0949   GLUCOSE 99 10/02/2016 1452   BUN 14 12/05/2018 0949   CREATININE 0.78 12/05/2018 0949   CREATININE 0.80 10/02/2016 1452   CALCIUM 9.6 12/05/2018 0949   GFRNONAA 86 12/05/2018 0949   GFRAA 100 12/05/2018 0949    BNP No results found for: BNP  ProBNP No results found for: PROBNP  Imaging: No results found.    No flowsheet data found.  No results found for: NITRICOXIDE      Assessment & Plan:   OSA (obstructive sleep apnea) Mild obstructive sleep apnea good control on CPAP.  Patient needs to increase compliance and nightly usage.   Plan  Patient Instructions  Continue on CPAP at bedtime Try to wear CPAP each night Goal was to wear for at least 4 to 6 hours each night Work on healthy weight Do not drive if sleepy Follow-up  with Dr. Elsworth Soho in 4  months and As needed          Rexene Edison, NP 02/16/2019

## 2019-02-16 NOTE — Patient Instructions (Addendum)
Continue on CPAP at bedtime Try to wear CPAP each night Goal was to wear for at least 4 to 6 hours each night Work on healthy weight Do not drive if sleepy Follow-up with Dr. Elsworth Soho in 4  months and As needed

## 2019-02-16 NOTE — Addendum Note (Signed)
Addended by: Parke Poisson E on: 02/16/2019 11:45 AM   Modules accepted: Orders

## 2019-02-16 NOTE — Assessment & Plan Note (Addendum)
Mild obstructive sleep apnea good control on CPAP.  Patient needs to increase compliance and nightly usage.  Plan  Patient Instructions  Continue on CPAP at bedtime Try to wear CPAP each night Goal was to wear for at least 4 to 6 hours each night Work on healthy weight Do not drive if sleepy Follow-up with Dr. Elsworth Soho in 4  months and As needed

## 2019-03-06 ENCOUNTER — Other Ambulatory Visit: Payer: Self-pay

## 2019-03-06 ENCOUNTER — Ambulatory Visit (INDEPENDENT_AMBULATORY_CARE_PROVIDER_SITE_OTHER): Payer: BC Managed Care – PPO

## 2019-03-06 DIAGNOSIS — Z23 Encounter for immunization: Secondary | ICD-10-CM

## 2019-03-06 NOTE — Progress Notes (Signed)
Pt here for influenza vaccine and Shingrix.  Screening questionnaire reviewed, VIS provided to patient, and any/all patient questions answered.  Charyl Bigger, CMA

## 2019-03-09 ENCOUNTER — Ambulatory Visit: Payer: BC Managed Care – PPO

## 2019-04-03 ENCOUNTER — Encounter: Payer: Self-pay | Admitting: Cardiovascular Disease

## 2019-04-04 ENCOUNTER — Encounter: Payer: Self-pay | Admitting: Cardiovascular Disease

## 2019-04-04 ENCOUNTER — Other Ambulatory Visit: Payer: Self-pay

## 2019-04-04 ENCOUNTER — Ambulatory Visit (INDEPENDENT_AMBULATORY_CARE_PROVIDER_SITE_OTHER): Payer: BC Managed Care – PPO | Admitting: Cardiovascular Disease

## 2019-04-04 VITALS — BP 124/74 | HR 74 | Ht 65.0 in | Wt 178.4 lb

## 2019-04-04 DIAGNOSIS — R079 Chest pain, unspecified: Secondary | ICD-10-CM

## 2019-04-04 DIAGNOSIS — R0789 Other chest pain: Secondary | ICD-10-CM

## 2019-04-04 NOTE — Progress Notes (Signed)
Cardiology Office Note:    Date:  04/04/2019   ID:  Deveron Furlong, DOB [birth year], MRN [id number]  PCP:  Esaw Grandchild, NP  Cardiologist:   Electrophysiologist:  None   Referring MD: Esaw Grandchild, NP   Chief Complaint  Patient presents with  . Chest Pain    History of Present Illness:    Danielle Arellano is a 53 y.o. female with a hx of chest pains in the past.  Had a physical in May, 2020 with Mina Marble, NP  We were asked to see her today by Mina Marble for further evaluation of these chest pains.  The pains have continued , worsened  Like a finger poke to the chest, always in the same place.   More often when she lies down at night. Lasts for hours at a time . 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  Has tried PPI - did not make any difference.  Has resolved for the most part   Works out in her garden .  Retired Education officer, environmental for 26 years.     Past Medical History:  Diagnosis Date  . Bladder mass 2016   right ureterocele on cystoscopy and CT  . Carpal tunnel syndrome   . Complication of anesthesia    itched post op-not sure what-used nubain  . Elevated hemoglobin A1c 02/14/15   HgbA1C 5.9  . Fibroid 05/08/06   TAH retained OV  . Foot mass, right ganglion 01/22/2012  . 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 No pertinent past medical history   . PONV (postoperative nausea and vomiting)    has motion sickness-used a scop patch  . Sleep apnea   . Thyromegaly    Normal thyroid US in 2014.  Marland Kitchen Ureterocele    right    Past Surgical History:  Procedure Laterality Date  . CESAREAN SECTION  04,01  . DIAGNOSTIC LAPAROSCOPY     mass rt ovary  . MASS EXCISION  01/22/2012   Procedure: EXCISION MASS;  Surgeon: Johnny Bridge, MD;  Location: [place];   Service: Orthopedics;  Laterality: Right;  Excision mass right dorsal foot  . Myomectomies  10/99   Multiple  . TOTAL ABDOMINAL HYSTERECTOMY  2007   TAH-adhesions    Current Medications: Current Meds  Medication Sig  . albuterol (VENTOLIN HFA) 108 (90 Base) MCG/ACT inhaler TAKE 2 PUFFS BY MOUTH EVERY 6 HOURS AS NEEDED FOR WHEEZE OR SHORTNESS OF BREATH  . Biotin 5 MG CAPS Take 1 capsule by mouth daily.  . Multiple Vitamin (MULTI-VITAMINS PO) Take by mouth daily.  . Omega-3 Fatty Acids (FISH OIL) 1000 MG CAPS Take by mouth daily.  Marland Kitchen XIIDRA 5 % SOLN      Allergies:   Patient has no known allergies.   Social History   Socioeconomic History  . Marital status: Married    Spouse name: Not on file  . Number of children: Not on file  . Years of education: Not on file  . Highest education level: Not on file  Occupational History  . Not on file  Social Needs  . Financial resource strain: Not on file  . Food insecurity    Worry: Not on file  Inability: Not on file  . Transportation needs    Medical: Not on file    Non-medical: Not on file  Tobacco Use  . Smoking status: Never Smoker  . Smokeless tobacco: Never Used  Substance and Sexual Activity  . Alcohol use: Not Currently    Alcohol/week: 0.0 standard drinks    Frequency: Never  . Drug use: No  . Sexual activity: Yes    Partners: Male    Birth control/protection: Surgical    Comment: TAH  Lifestyle  . Physical activity    Days per week: Not on file    Minutes per session: Not on file  . Stress: Not on file  Relationships  . Social Herbalist on phone: Not on file    Gets together: Not on file    Attends religious service: Not on file    Active member of club or organization: Not on file    Attends meetings of clubs or organizations: Not on file    Relationship status: Not on file  Other Topics Concern  . Not on file  Social History Narrative  . Not on file     Family History: The patient's  family history includes Arthritis in her mother; Cancer in her brother; Epilepsy in her father; Multiple myeloma in her brother; Stroke in her father.  ROS:   Please see the history of present illness.     All other systems reviewed and are negative.  EKGs/Labs/Other Studies Reviewed:    The following studies were reviewed today:   EKG:   Oct. 27, 2020 .  NSR at 74  Recent Labs: 12/05/2018: ALT 32; BUN 14; Creatinine, Ser 0.78; Hemoglobin 13.9; Platelets 247; Potassium 4.7; Sodium 141; TSH 2.600  Recent Lipid Panel    Component Value Date/Time   CHOL 167 12/05/2018 0949   TRIG 69 12/05/2018 0949   HDL 49 12/05/2018 0949   CHOLHDL 3.4 12/05/2018 0949   CHOLHDL 2.8 10/02/2016 1452   VLDL 14 10/02/2016 1452   LDLCALC 104 (H) 12/05/2018 0949    Physical Exam:    VS:  BP 124/74   Pulse 74   Ht '5\' 5"'$  (1.651 m)   Wt 178 lb 6.4 oz (80.9 kg)   LMP 06/08/2005 (Approximate)   SpO2 99%   BMI 29.69 kg/m     Wt Readings from Last 3 Encounters:  04/04/19 178 lb 6.4 oz (80.9 kg)  02/16/19 175 lb (79.4 kg)  12/07/18 180 lb 4.8 oz (81.8 kg)     GEN:  Well nourished, well developed in no acute distress HEENT: Normal NECK: No JVD; No carotid bruits LYMPHATICS: No lymphadenopathy CARDIAC: RRR, no murmurs, rubs, gallops RESPIRATORY:  Clear to auscultation without rales, wheezing or rhonchi  ABDOMEN: Soft, non-tender, non-distended MUSCULOSKELETAL:  No edema; No deformity  SKIN: Warm and dry NEUROLOGIC:  Alert and oriented x 3 PSYCHIATRIC:  Normal affect   ASSESSMENT:    1. Chest pain, unspecified type    PLAN:    In order of problems listed above:  1. Chest pain: Danielle Arellano  presents today for further evaluation of some chest pain.  The chest pains were very atypical.  There was like a poking-like sensation that would last for hours.  It gradually resolved and she is not had it now for the past several weeks.  It was not related to twisting or turning of her torso.  It was not  related to exercise.  She is exercising on a regular  basis and has not had any particular problems.  Her cholesterol levels look great.  She does not have family history of coronary disease.  At this point we will see her on an as-needed basis.  I have reassured her that the chest pain does not sound cardiac.  I have encouraged her to work on diet, exercise, weight loss program.  We will see her on an as-needed basis.   Medication Adjustments/Labs and Tests Ordered: Current medicines are reviewed at length with the patient today.  Concerns regarding medicines are outlined above.  Orders Placed This Encounter  Procedures  . EKG 12-Lead   No orders of the defined types were placed in this encounter.   Patient Instructions  Medication Instructions:  Your provider recommends that you continue on your current medications as directed. Please refer to the Current Medication list given to you today.    Labwork: None  Testing/Procedures: None  Follow-Up: Please let us know if you need anything!    Signed, Mertie Moores, MD  04/04/2019 1:07 PM    [place]

## 2019-04-04 NOTE — Patient Instructions (Signed)
Medication Instructions:  Your provider recommends that you continue on your current medications as directed. Please refer to the Current Medication list given to you today.    Labwork: None  Testing/Procedures: None  Follow-Up: Please let us know if you need anything!

## 2019-06-05 ENCOUNTER — Other Ambulatory Visit: Payer: BC Managed Care – PPO

## 2019-06-05 ENCOUNTER — Ambulatory Visit: Payer: BC Managed Care – PPO | Attending: Internal Medicine

## 2019-06-05 DIAGNOSIS — Z20822 Contact with and (suspected) exposure to covid-19: Secondary | ICD-10-CM

## 2019-06-07 LAB — NOVEL CORONAVIRUS, NAA: SARS-CoV-2, NAA: NOT DETECTED

## 2019-06-09 DIAGNOSIS — L439 Lichen planus, unspecified: Secondary | ICD-10-CM

## 2019-06-09 HISTORY — DX: Lichen planus, unspecified: L43.9

## 2019-06-15 ENCOUNTER — Ambulatory Visit: Payer: BC Managed Care – PPO | Admitting: Adult Health

## 2019-06-29 ENCOUNTER — Other Ambulatory Visit: Payer: Self-pay

## 2019-06-29 NOTE — Progress Notes (Signed)
54 y.o. G2P2 Married [place] American female here for annual exam.   Patient complains of constant vaginal irritation and feels it is related to her soap.  States she used [employer] and felt irritated from it  Having itching sometimes.  Denies discharge and odor.   Trying to loose weight.  Walking a lot.   Feels more heat at night and if she eats more sugar.  Her last A1C is 5.5.   Retired.   PCP: Mina Marble, NP    Patient's last menstrual period was 06/08/2005 (approximate).           Sexually active: Yes.    The current method of family planning is status post hysterectomy.    Exercising: Yes.    moderate walking Smoker:  no  Health Maintenance: Pap: 05/2008 Neg History of abnormal Pap:  no MMG: 12-27-18 Neg/density C/BiRads1 Colonoscopy: 12/2014 normal;next due 12/2024 BMD:   n/a  Result  n/a TDaP: 12-07-18 Gardasil:   no HIV: 09-27-15 Hep C: 09-27-15 Screening Labs:  PCP.  Flu vaccine:  Completed.  Shingrix:  Completed.    reports that she has never smoked. She has never used smokeless tobacco. She reports previous alcohol use. She reports that she does not use drugs.  Past Medical History:  Diagnosis Date  . Bladder mass 2016   right ureterocele on cystoscopy and CT  . Carpal tunnel syndrome   . Complication of anesthesia    itched post op-not sure what-used nubain  . Elevated hemoglobin A1c 02/14/15   HgbA1C 5.9  . Fibroid 05/08/06   TAH retained OV  . Foot mass, right ganglion 01/22/2012  . 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 No pertinent past medical history   . PONV (postoperative nausea and vomiting)    has motion sickness-used a scop patch  . Sleep apnea   . Thyromegaly    Normal thyroid US in 2014.  Marland Kitchen Ureterocele    right    Past Surgical History:  Procedure Laterality Date  . CESAREAN SECTION  04,01  . DIAGNOSTIC LAPAROSCOPY     mass rt ovary  . MASS EXCISION  01/22/2012   Procedure: EXCISION MASS;  Surgeon: Johnny Bridge, MD;   Location: [place];  Service: Orthopedics;  Laterality: Right;  Excision mass right dorsal foot  . Myomectomies  10/99   Multiple  . TOTAL ABDOMINAL HYSTERECTOMY  2007   TAH-adhesions    Current Outpatient Medications  Medication Sig Dispense Refill  . albuterol (VENTOLIN HFA) 108 (90 Base) MCG/ACT inhaler TAKE 2 PUFFS BY MOUTH EVERY 6 HOURS AS NEEDED FOR WHEEZE OR SHORTNESS OF BREATH 18 g 0  . Biotin 5 MG CAPS Take 1 capsule by mouth daily.    . Multiple Vitamin (MULTI-VITAMINS PO) Take by mouth daily.    . Omega-3 Fatty Acids (FISH OIL) 1000 MG CAPS Take 1 capsule by mouth daily.    Marland Kitchen XIIDRA 5 % SOLN      No current facility-administered medications for this visit.    Family History  Problem Relation Age of Onset  . Epilepsy Father   . Stroke Father   . Arthritis Mother   . Multiple myeloma Brother   . Cancer Brother        multiple myeloma    Review of Systems  All other systems reviewed and are negative.   Exam:   BP 122/70 (Cuff Size: Large)   Pulse 84   Temp (!) 96.5 F (  35.8 C) (Temporal)   Resp 16   Ht 5' 4.75" (1.645 m)   Wt 178 lb 6.4 oz (80.9 kg)   LMP 06/08/2005 (Approximate)   BMI 29.92 kg/m     General appearance: alert, cooperative and appears stated age Head: normocephalic, without obvious abnormality, atraumatic Neck: no adenopathy, supple, symmetrical, trachea midline and thyroid normal to inspection and palpation Lungs: clear to auscultation bilaterally Breasts: normal appearance, no masses or tenderness, No nipple retraction or dimpling, No nipple discharge or bleeding, No axillary adenopathy Heart: regular rate and rhythm Abdomen: soft, non-tender; no masses, no organomegaly Extremities: extremities normal, atraumatic, no cyanosis or edema Skin: skin color, texture, turgor normal. No rashes or lesions Lymph nodes: cervical, supraclavicular, and axillary nodes normal. Neurologic: grossly normal  Pelvic: External genitalia:   no lesions              No abnormal inguinal nodes palpated.              Urethra:  normal appearing urethra with no masses, tenderness or lesions              Bartholins and Skenes: normal                 Vagina: normal appearing vagina with normal color and discharge, no lesions.  Atrophy noted.               Cervix: absent              Pap taken: No. Bimanual Exam:  Uterus:  absent              Adnexa: no mass, fullness, tenderness              Rectal exam: Yes.  .  Confirms.              Anus:  normal sphincter tone, no lesions  Chaperone was present for exam.  Assessment:   Well woman visit with normal exam. Status post TAH.  Status post LSO.  Right ovary remains. Ureterocele. Hx thyromegaly.  Hx elevated hemoglobin A1C. Lichen planus. Right arm.  Treated with clobetasol.  Vaginitis.  Atrophy present.   Plan: Mammogram screening discussed. Self breast awareness reviewed. Pap and HR HPV as above. Guidelines for Calcium, Vitamin D, regular exercise program including cardiovascular and weight bearing exercise. Affirm.  We discussed vaginitis, including atrophic vaginitis.  Vaginal hydration with water based lubricants, cooking oils, and vaginal estrogens discussed.  Follow up annually and prn.    After visit summary provided.

## 2019-07-03 ENCOUNTER — Other Ambulatory Visit: Payer: Self-pay

## 2019-07-03 ENCOUNTER — Encounter: Payer: Self-pay | Admitting: Obstetrics and Gynecology

## 2019-07-03 ENCOUNTER — Ambulatory Visit (INDEPENDENT_AMBULATORY_CARE_PROVIDER_SITE_OTHER): Payer: BC Managed Care – PPO | Admitting: Obstetrics and Gynecology

## 2019-07-03 VITALS — BP 122/70 | HR 84 | Temp 96.5°F | Resp 16 | Ht 64.75 in | Wt 178.4 lb

## 2019-07-03 DIAGNOSIS — Z01419 Encounter for gynecological examination (general) (routine) without abnormal findings: Secondary | ICD-10-CM

## 2019-07-03 DIAGNOSIS — N760 Acute vaginitis: Secondary | ICD-10-CM | POA: Diagnosis not present

## 2019-07-03 NOTE — Patient Instructions (Signed)
EXERCISE AND DIET:  We recommended that you start or continue a regular exercise program for good health. Regular exercise means any activity that makes your heart beat faster and makes you sweat.  We recommend exercising at least 30 minutes per day at least 3 days a week, preferably 4 or 5.  We also recommend a diet low in fat and sugar.  Inactivity, poor dietary choices and obesity can cause diabetes, heart attack, stroke, and kidney damage, among others.    ALCOHOL AND SMOKING:  Women should limit their alcohol intake to no more than 7 drinks/beers/glasses of wine (combined, not each!) per week. Moderation of alcohol intake to this level decreases your risk of breast cancer and liver damage. And of course, no recreational drugs are part of a healthy lifestyle.  And absolutely no smoking or even second hand smoke. Most people know smoking can cause heart and lung diseases, but did you know it also contributes to weakening of your bones? Aging of your skin?  Yellowing of your teeth and nails?  CALCIUM AND VITAMIN D:  Adequate intake of calcium and Vitamin D are recommended.  The recommendations for exact amounts of these supplements seem to change often, but generally speaking 600 mg of calcium (either carbonate or citrate) and 800 units of Vitamin D per day seems prudent. Certain women may benefit from higher intake of Vitamin D.  If you are among these women, your doctor will have told you during your visit.    PAP SMEARS:  Pap smears, to check for cervical cancer or precancers,  have traditionally been done yearly, although recent scientific advances have shown that most women can have pap smears less often.  However, every woman still should have a physical exam from her gynecologist every year. It will include a breast check, inspection of the vulva and vagina to check for abnormal growths or skin changes, a visual exam of the cervix, and then an exam to evaluate the size and shape of the uterus and  ovaries.  And after 56 years of age, a rectal exam is indicated to check for rectal cancers. We will also provide age appropriate advice regarding health maintenance, like when you should have certain vaccines, screening for sexually transmitted diseases, bone density testing, colonoscopy, mammograms, etc.   MAMMOGRAMS:  All women over 40 years old should have a yearly mammogram. Many facilities now offer a "3D" mammogram, which may cost around $50 extra out of pocket. If possible,  we recommend you accept the option to have the 3D mammogram performed.  It both reduces the number of women who will be called back for extra views which then turn out to be normal, and it is better than the routine mammogram at detecting truly abnormal areas.    COLONOSCOPY:  Colonoscopy to screen for colon cancer is recommended for all women at age 50.  We know, you hate the idea of the prep.  We agree, BUT, having colon cancer and not knowing it is worse!!  Colon cancer so often starts as a polyp that can be seen and removed at colonscopy, which can quite literally save your life!  And if your first colonoscopy is normal and you have no family history of colon cancer, most women don't have to have it again for 10 years.  Once every ten years, you can do something that may end up saving your life, right?  We will be happy to help you get it scheduled when you are ready.    Be sure to check your insurance coverage so you understand how much it will cost.  It may be covered as a preventative service at no cost, but you should check your particular policy.      Atrophic Vaginitis  Atrophic vaginitis is a condition in which the tissues that line the vagina become dry and thin. This condition is most common in women who have stopped having regular menstrual periods (are in menopause). This usually starts when a woman is 45-55 years old. That is the time when a woman's estrogen levels begin to drop (decrease). Estrogen is a female  hormone. It helps to keep the tissues of the vagina moist. It stimulates the vagina to produce a clear fluid that lubricates the vagina for sexual intercourse. This fluid also protects the vagina from infection. Lack of estrogen can cause the lining of the vagina to get thinner and dryer. The vagina may also shrink in size. It may become less elastic. Atrophic vaginitis tends to get worse over time as a woman's estrogen level drops. What are the causes? This condition is caused by the normal drop in estrogen that happens around the time of menopause. What increases the risk? Certain conditions or situations may lower a woman's estrogen level, leading to a higher risk for atrophic vaginitis. You are more likely to develop this condition if:  You are taking medicines that block estrogen.  You have had your ovaries removed.  You are being treated for cancer with X-ray (radiation) or medicines (chemotherapy).  You have given birth or are breastfeeding.  You are older than age 50.  You smoke. What are the signs or symptoms? Symptoms of this condition include:  Pain, soreness, or bleeding during sexual intercourse (dyspareunia).  Vaginal burning, irritation, or itching.  Pain or bleeding when a speculum is used in a vaginal exam (pelvic exam).  Having burning pain when passing urine.  Vaginal discharge that is brown or yellow. In some cases, there are no symptoms. How is this diagnosed? This condition is diagnosed by taking a medical history and doing a physical exam. This will include a pelvic exam that checks the vaginal tissues. Though rare, you may also have other tests, including:  A urine test.  A test that checks the acid balance in your vagina (acid balance test). How is this treated? Treatment for this condition depends on how severe your symptoms are. Treatment may include:  Using an over-the-counter vaginal lubricant before sex.  Using a long-acting vaginal  moisturizer.  Using low-dose vaginal estrogen for moderate to severe symptoms that do not respond to other treatments. Options include creams, tablets, and inserts (vaginal rings). Before you use a vaginal estrogen, tell your health care provider if you have a history of: ? Breast cancer. ? Endometrial cancer. ? Blood clots. If you are not sexually active and your symptoms are very mild, you may not need treatment. Follow these instructions at home: Medicines  Take over-the-counter and prescription medicines only as told by your health care provider. Do not use herbal or alternative medicines unless your health care provider says that you can.  Use over-the-counter creams, lubricants, or moisturizers for dryness only as directed by your health care provider. General instructions  If your atrophic vaginitis is caused by menopause, discuss all of your menopause symptoms and treatment options with your health care provider.  Do not douche.  Do not use products that can make your vagina dry. These include: ? Scented feminine sprays. ? Scented tampons. ?   Scented soaps.  Vaginal intercourse can help to improve blood flow and elasticity of vaginal tissue. If it hurts to have sex, try using a lubricant or moisturizer just before having intercourse. Contact a health care provider if:  Your discharge looks different than normal.  Your vagina has an unusual smell.  You have new symptoms.  Your symptoms do not improve with treatment.  Your symptoms get worse. Summary  Atrophic vaginitis is a condition in which the tissues that line the vagina become dry and thin. It is most common in women who have stopped having regular menstrual periods (are in menopause).  Treatment options include using vaginal lubricants and low-dose vaginal estrogen.  Contact a health care provider if your vagina has an unusual smell, or if your symptoms get worse or do not improve after treatment. This  information is not intended to replace advice given to you by your health care provider. Make sure you discuss any questions you have with your health care provider. Document Revised: 05/07/2017 Document Reviewed: 02/18/2017 Elsevier Patient Education  2020 [employer] Inc[employer]  

## 2019-07-04 LAB — VAGINITIS/VAGINOSIS, DNA PROBE
Candida Species: NEGATIVE
Gardnerella vaginalis: NEGATIVE
Trichomonas vaginosis: NEGATIVE

## 2019-08-01 ENCOUNTER — Ambulatory Visit (INDEPENDENT_AMBULATORY_CARE_PROVIDER_SITE_OTHER): Payer: BC Managed Care – PPO | Admitting: Pulmonary Disease

## 2019-08-01 ENCOUNTER — Encounter: Payer: Self-pay | Admitting: Pulmonary Disease

## 2019-08-01 ENCOUNTER — Other Ambulatory Visit: Payer: Self-pay

## 2019-08-01 DIAGNOSIS — G4733 Obstructive sleep apnea (adult) (pediatric): Secondary | ICD-10-CM | POA: Diagnosis not present

## 2019-08-01 NOTE — Patient Instructions (Signed)
Trial of airfit N 30 nasal cradle mask  If this does not work, then we can refer to dentist for oral appliance

## 2019-08-01 NOTE — Assessment & Plan Note (Signed)
Reviewed compliance data-CPAP is effective unfortunately she is not very compliant, only 25% more than 4 hours per night.  She attributes this to simply laziness or poor fitting mask We reviewed different kinds of nasal masks Trial of airfit N 30 nasal cradle mask  If this does not work, then we can refer to dentist for oral appliance Perhaps she may also be a candidate for inspire  Weight loss encouraged, compliance with goal of at least 4-6 hrs every night is the expectation. Advised against medications with sedative side effects Cautioned against driving when sleepy - understanding that sleepiness will vary on a day to day basis

## 2019-08-01 NOTE — Progress Notes (Signed)
   Subjective:    Patient ID: Danielle Arellano, female    DOB: Jul 24, 1965, 54 y.o.   MRN: [id number]  HPI  54 year old retired [employer] for follow-up of mild OSA  She was started on auto CPAP with good improvement of symptoms of daytime somnolence and fatigue. She settled with a nasal mask, did not like a full face and nasal pillows seem to be too invasive. Reviewed compliance report, auto settings 5 to 12 cm with average pressure of 12 cm with good control of events and minimal leak but compliance is poor average 3 hours 45 minutes and only 25% of usage more than 4 hours per night  On nights that she uses her machine, she feels rested.  Significant tests/ events reviewed   NPSG 10/2018 >> mild OSA, events were only noted during REM sleep, AHI 9/h, REM RDI 32/h  Review of Systems Patient denies significant dyspnea,cough, hemoptysis,  chest pain, palpitations, pedal edema, orthopnea, paroxysmal nocturnal dyspnea, lightheadedness, nausea, vomiting, abdominal or  leg pains      Objective:   Physical Exam  Gen. Pleasant, well-nourished, in no distress ENT - no thrush, no pallor/icterus,no post nasal drip, mild overbite Neck: No JVD, no thyromegaly, no carotid bruits Lungs: no use of accessory muscles, no dullness to percussion, clear without rales or rhonchi  Cardiovascular: Rhythm regular, heart sounds  normal, no murmurs or gallops, no peripheral edema Musculoskeletal: No deformities, no cyanosis or clubbing         Assessment & Plan:

## 2019-09-28 ENCOUNTER — Telehealth: Payer: BC Managed Care – PPO | Admitting: Physician Assistant

## 2019-09-28 DIAGNOSIS — R399 Unspecified symptoms and signs involving the genitourinary system: Secondary | ICD-10-CM | POA: Diagnosis not present

## 2019-09-28 MED ORDER — NITROFURANTOIN MONOHYD MACRO 100 MG PO CAPS: 100.0000 mg | ORAL_CAPSULE | Freq: Two times a day (BID) | ORAL | 0 refills | Status: AC

## 2019-09-28 NOTE — Progress Notes (Signed)

## 2019-11-22 ENCOUNTER — Ambulatory Visit (INDEPENDENT_AMBULATORY_CARE_PROVIDER_SITE_OTHER): Payer: BC Managed Care – PPO | Admitting: Physician Assistant

## 2019-11-22 ENCOUNTER — Encounter: Payer: Self-pay | Admitting: Physician Assistant

## 2019-11-22 ENCOUNTER — Other Ambulatory Visit: Payer: Self-pay

## 2019-11-22 DIAGNOSIS — L309 Dermatitis, unspecified: Secondary | ICD-10-CM | POA: Diagnosis not present

## 2019-11-22 NOTE — Progress Notes (Signed)
   Follow up Visit  Subjective  Danielle Arellano is a 54 y.o. AA female who presents for the following: Rash (under both arms treatment , vinagar, destin, hydrocortisone. now starting under the breast). Rash in underarms for at least 1 month. It started in both underarms evenly and was itching with bumps. She had gotten new bras but she also has it under breasts and inner thighs. It itches bad at night and so she takes bendaryl and this helps. She also had tried desitin, hydrocortisone, applecider vinegar, monistat and vagisil.This is different than the lichen planus she had in the past.  Objective  Well appearing patient in no apparent distress; mood and affect are within normal limits.  A focused examination was performed including axillae, breasts, abdomen and thighs. Relevant physical exam findings are noted in the Assessment and Plan.   Objective  Left Medial Thigh, Right Axilla, Right Inframammary Fold: Few Tiny annular papules in each of the listed areas. Left axilla only shows hyperpigmentation. This favors LP but also favors tiny areas of GA.  Assessment & Plan  Dermatitis (3) Right Inframammary Fold; Right Axilla; Left Medial Thigh  We discussed the option to biopsy. Pt does not want biopsy at this time. She has clobetasol at home that we will treat these areas with sparingly for 1 month. If she doesn't clear or continues to get new areas she will call for biopsy. Warned of risk of long term steroid use in these areas.

## 2019-11-28 ENCOUNTER — Other Ambulatory Visit: Payer: Self-pay | Admitting: Obstetrics and Gynecology

## 2019-11-28 DIAGNOSIS — Z1231 Encounter for screening mammogram for malignant neoplasm of breast: Secondary | ICD-10-CM

## 2020-01-08 ENCOUNTER — Telehealth: Payer: Self-pay

## 2020-01-08 NOTE — Telephone Encounter (Signed)
AEX [birth year] Hx Lichen planus, on clobetasol Rx for flares   Spoke with pt. Pt states having vaginal itching/rash on outside of both labia that now goes to rectum area x 2 weeks ago. Pt states using OTC Monistat Care and it helps some and reapplies every 3-4 hours, but does not resolve completely. Pt denies any vaginal discharge, odor, fever, chills at this time.  Pt advised to be seen for further evaluation. Pt agreeable.  Pt scheduled an OV with Dr Quincy Simmonds on 8/3 at 9 am. Pt verbalized understanding and is agreeable to date and time of appt.   Encounter closed.

## 2020-01-08 NOTE — Telephone Encounter (Signed)
Patient is calling in regards to having severe itching in vaginal area and rash.

## 2020-01-09 ENCOUNTER — Encounter: Payer: Self-pay | Admitting: Obstetrics and Gynecology

## 2020-01-09 ENCOUNTER — Other Ambulatory Visit: Payer: Self-pay

## 2020-01-09 ENCOUNTER — Ambulatory Visit (INDEPENDENT_AMBULATORY_CARE_PROVIDER_SITE_OTHER): Payer: BC Managed Care – PPO | Admitting: Obstetrics and Gynecology

## 2020-01-09 VITALS — BP 124/72 | HR 76 | Ht 64.75 in | Wt 176.4 lb

## 2020-01-09 DIAGNOSIS — N766 Ulceration of vulva: Secondary | ICD-10-CM

## 2020-01-09 DIAGNOSIS — N760 Acute vaginitis: Secondary | ICD-10-CM

## 2020-01-09 MED ORDER — NYSTATIN-TRIAMCINOLONE 100000-0.1 UNIT/GM-% EX CREA: 1.0000 "application " | TOPICAL_CREAM | Freq: Two times a day (BID) | CUTANEOUS | 0 refills | Status: DC

## 2020-01-09 NOTE — Progress Notes (Signed)
GYNECOLOGY  VISIT   HPI: 54 y.o.   Married  [place] American  female   G32P2 with Patient's last menstrual period was 06/08/2005 (approximate).   here for vaginal itching.   Patient has seen dermatologist recently and diagnosed with lichen planus on her inner thighs and her underarm area.  She was given clobetasol for this.   She has itching and vulvar burning.  Burns with voiding.  Using Monistat cream, and this helps.   Taking Benadryl helps with the itching.   She is noticing vulvar and anal opening bumps. They are not painful.  No discharge.  No odor.   Normal A1C last year.  Due for a recheck again.   Enjoying retirement.   GYNECOLOGIC HISTORY: Patient's last menstrual period was 06/08/2005 (approximate). Contraception:  Hyst Menopausal hormone therapy:  none Last mammogram: 12-27-18 Neg/density C/BiRads1--appt. tomorrow Last pap smear: 05/2008 Neg        OB History    Gravida  2   Para  2   Term      Preterm      AB      Living  2     SAB      TAB      Ectopic      Multiple      Live Births                 Patient Active Problem List   Diagnosis Date Noted  . Left chest pressure 12/07/2018  . Sinus pressure 09/29/2018  . Cough in adult 09/29/2018  . OSA (obstructive sleep apnea) 08/04/2018  . Tonsillolith 07/13/2018  . Fatigue 07/13/2018  . Loud snoring 07/13/2018  . Healthcare maintenance 07/13/2018  . Laboratory exam ordered as part of routine general medical examination 02/14/2015  . Elevated hemoglobin A1c 02/14/2015  . 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 Foot mass, right ganglion 01/22/2012    Past Medical History:  Diagnosis Date  . Bladder mass 2016   right ureterocele on cystoscopy and CT  . Carpal tunnel syndrome   . Complication of anesthesia    itched post op-not sure what-used nubain  . Elevated hemoglobin A1c 02/14/15   HgbA1C 5.9  . Fibroid 05/08/06   TAH retained OV  . Foot mass, right ganglion 01/22/2012  .  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 No pertinent past medical history   . PONV (postoperative nausea and vomiting)    has motion sickness-used a scop patch  . Sleep apnea   . Thyromegaly    Normal thyroid US in 2014.  Marland Kitchen Ureterocele    right    Past Surgical History:  Procedure Laterality Date  . CESAREAN SECTION  04,01  . DIAGNOSTIC LAPAROSCOPY     mass rt ovary  . MASS EXCISION  01/22/2012   Procedure: EXCISION MASS;  Surgeon: Johnny Bridge, MD;  Location: [facility];  Service: Orthopedics;  Laterality: Right;  Excision mass right dorsal foot  . Myomectomies  10/99   Multiple  . TOTAL ABDOMINAL HYSTERECTOMY  2007   TAH-adhesions    Current Outpatient Medications  Medication Sig Dispense Refill  . albuterol (VENTOLIN HFA) 108 (90 Base) MCG/ACT inhaler TAKE 2 PUFFS BY MOUTH EVERY 6 HOURS AS NEEDED FOR WHEEZE OR SHORTNESS OF BREATH 18 g 0  . Biotin 5 MG CAPS Take 1 capsule by mouth daily.    . LUTEIN PO Take 1 tablet by mouth daily.    . Multiple Vitamin (  MULTI-VITAMINS PO) Take by mouth daily.    . Omega-3 Fatty Acids (FISH OIL) 1000 MG CAPS Take 1 capsule by mouth daily.    Marland Kitchen XIIDRA 5 % SOLN      No current facility-administered medications for this visit.     ALLERGIES: Patient has no known allergies.  Family History  Problem Relation Age of Onset  . Epilepsy Father   . Stroke Father   . Arthritis Mother   . Multiple myeloma Brother   . Cancer Brother        multiple myeloma    Social History   Socioeconomic History  . Marital status: Married    Spouse name: Not on file  . Number of children: Not on file  . Years of education: Not on file  . Highest education level: Not on file  Occupational History  . Not on file  Tobacco Use  . Smoking status: Never Smoker  . Smokeless tobacco: Never Used  Vaping Use  . Vaping Use: Never used  Substance and Sexual Activity  . Alcohol use: Not Currently    Alcohol/week: 0.0 standard drinks  .  Drug use: No  . Sexual activity: Yes    Partners: Male    Birth control/protection: Surgical    Comment: TAH  Other Topics Concern  . Not on file  Social History Narrative  . Not on file   Social Determinants of Health   Financial Resource Strain:   . Difficulty of Paying Living Expenses:   Food Insecurity:   . Worried About Charity fundraiser in the Last Year:   . Arboriculturist in the Last Year:   Transportation Needs:   . Film/video editor (Medical):   Marland Kitchen Lack of Transportation (Non-Medical):   Physical Activity:   . Days of Exercise per Week:   . Minutes of Exercise per Session:   Stress:   . Feeling of Stress :   Social Connections:   . Frequency of Communication with Friends and Family:   . Frequency of Social Gatherings with Friends and Family:   . Attends Religious Services:   . Active Member of Clubs or Organizations:   . Attends Archivist Meetings:   Marland Kitchen Marital Status:   Intimate Partner Violence:   . Fear of Current or Ex-Partner:   . Emotionally Abused:   Marland Kitchen Physically Abused:   . Sexually Abused:     Review of Systems  All other systems reviewed and are negative.   PHYSICAL EXAMINATION:    BP 124/72   Pulse 76   Ht 5' 4.75" (1.645 m)   Wt 176 lb 6.4 oz (80 kg)   LMP 06/08/2005 (Approximate)   BMI 29.58 kg/m     General appearance: alert, cooperative and appears stated age   Pelvic: External genitalia:  Multiple nontender scattered 1 mm ulcers.               Urethra:  normal appearing urethra with no masses, tenderness or lesions              Bartholins and Skenes: normal                 Vagina: normal appearing vagina with normal color and discharge, no lesions              Cervix: absent                Bimanual Exam:  Uterus:  absent  Adnexa: no mass, fullness, tenderness         Chaperone was present for exam.  ASSESSMENT  Vulvovaginitis.  Vulvar ulceration.  Lichen planus.  Treated with  Clobetasol.  PLAN  Mycolog II.  Affirm testing.  HSV testing of the vulva.  Ok to use Benadryl for pruritis.  May need to use Clobetasol on there vulva as well.  Consider vulvar biopsy if symptoms do not improve. Avoid products on vulva.  Use [place] soap.     23 minute consultation

## 2020-01-10 ENCOUNTER — Ambulatory Visit
Admission: RE | Admit: 2020-01-10 | Discharge: 2020-01-10 | Disposition: A | Discharge status: Home / Self Care | Payer: BC Managed Care – PPO | Source: Ambulatory Visit | Attending: Obstetrics and Gynecology | Admitting: Obstetrics and Gynecology

## 2020-01-10 DIAGNOSIS — Z1231 Encounter for screening mammogram for malignant neoplasm of breast: Secondary | ICD-10-CM

## 2020-01-10 LAB — VAGINITIS/VAGINOSIS, DNA PROBE
Candida Species: NEGATIVE
Gardnerella vaginalis: NEGATIVE
Trichomonas vaginosis: NEGATIVE

## 2020-01-11 LAB — HSV NAA
HSV 1 NAA: NEGATIVE
HSV 2 NAA: NEGATIVE

## 2020-01-15 ENCOUNTER — Other Ambulatory Visit: Payer: Self-pay | Admitting: Obstetrics and Gynecology

## 2020-01-15 DIAGNOSIS — R928 Other abnormal and inconclusive findings on diagnostic imaging of breast: Secondary | ICD-10-CM

## 2020-01-17 ENCOUNTER — Ambulatory Visit: Payer: BC Managed Care – PPO

## 2020-01-17 ENCOUNTER — Other Ambulatory Visit: Payer: Self-pay

## 2020-01-17 ENCOUNTER — Ambulatory Visit
Admission: RE | Admit: 2020-01-17 | Discharge: 2020-01-17 | Disposition: A | Discharge status: Home / Self Care | Payer: BC Managed Care – PPO | Source: Ambulatory Visit | Attending: Obstetrics and Gynecology | Admitting: Obstetrics and Gynecology

## 2020-01-17 DIAGNOSIS — R928 Other abnormal and inconclusive findings on diagnostic imaging of breast: Secondary | ICD-10-CM

## 2020-01-29 ENCOUNTER — Ambulatory Visit: Payer: BC Managed Care – PPO | Admitting: Adult Health

## 2020-02-01 ENCOUNTER — Other Ambulatory Visit: Payer: Self-pay

## 2020-02-01 ENCOUNTER — Encounter: Payer: Self-pay | Admitting: Adult Health

## 2020-02-01 ENCOUNTER — Ambulatory Visit (INDEPENDENT_AMBULATORY_CARE_PROVIDER_SITE_OTHER): Payer: BC Managed Care – PPO | Admitting: Adult Health

## 2020-02-01 DIAGNOSIS — G4733 Obstructive sleep apnea (adult) (pediatric): Secondary | ICD-10-CM | POA: Diagnosis not present

## 2020-02-01 NOTE — Assessment & Plan Note (Signed)
Mild OSA , difficulty tolerating CPAP mask  Will change mask if unable to tolerate consider referral for oral appliance   Plan  Patient Instructions  Continue on CPAP at bedtime Try to wear CPAP each night Goal was to wear for at least 4 to 6 hours each night Work on healthy weight Do not drive if sleepy Order for nasal mask . Trial of airfit N 30 nasal cradle mask If not able to work call back can refer for oral appliance.  Follow-up with Dr. Elsworth Soho in 6  months and As needed

## 2020-02-01 NOTE — Addendum Note (Signed)
Addended by: Vanessa Barbara on: 02/01/2020 10:14 AM   Modules accepted: Orders

## 2020-02-01 NOTE — Patient Instructions (Addendum)
Continue on CPAP at bedtime Try to wear CPAP each night Goal was to wear for at least 4 to 6 hours each night Work on healthy weight Do not drive if sleepy Order for nasal mask . Trial of airfit N 30 nasal cradle mask If not able to work call back can refer for oral appliance.  Follow-up with Dr. Elsworth Soho in 6  months and As needed

## 2020-02-01 NOTE — Progress Notes (Signed)
@Patient  ID: Danielle Arellano, female    DOB: 1965/07/01, 54 y.o.   MRN: [id number]  Chief Complaint  Patient presents with  . Follow-up    OSA    Referring provider: Esaw Grandchild, NP  HPI: 54 year old female followed for Mild obstructive sleep apnea  TEST/EVENTS :  NPSG 10/2018 >>mild OSA, events were only noted during REM sleep, AHI 9/h, REM RDI 32/h  02/01/2020 Follow up : OSA  Patient presents for a follow-up visit.  Patient has underlying mild obstructive sleep apnea.  Has been on nocturnal CPAP.  Says she continues to have difficulty.  She tries to wear it but it is uncomfortable and has not had much luck in getting used to it. Does not have much daytime sleepiness. Does still have snoring. Discussed OSA and potential complications of untreated sleep apnea.  Wants to try the airlift N30 nasal cradle mask. We discussed if this does not work can consider oral appliance .      No Known Allergies  Immunization History  Administered Date(s) Administered  . Influenza,inj,Quad PF,6+ Mos 03/06/2019  . Influenza-Unspecified 03/08/2018  . Tdap 12/07/2018  . Zoster Recombinat (Shingrix) 12/07/2018, 03/06/2019    Past Medical History:  Diagnosis Date  . Bladder mass 2016   right ureterocele on cystoscopy and CT  . Carpal tunnel syndrome   . Complication of anesthesia    itched post op-not sure what-used nubain  . Elevated hemoglobin A1c 02/14/15   HgbA1C 5.9  . Fibroid 05/08/06   TAH retained OV  . Foot mass, right ganglion 01/22/2012  . 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 Lichen planus 9326  . No pertinent past medical history   . PONV (postoperative nausea and vomiting)    has motion sickness-used a scop patch  . Sleep apnea   . Thyromegaly    Normal thyroid US in 2014.  Marland Kitchen Ureterocele    right    Tobacco History: Social History   Tobacco Use  Smoking Status Never Smoker  Smokeless Tobacco Never Used   Counseling given: Not  Answered   Outpatient Medications Prior to Visit  Medication Sig Dispense Refill  . albuterol (VENTOLIN HFA) 108 (90 Base) MCG/ACT inhaler TAKE 2 PUFFS BY MOUTH EVERY 6 HOURS AS NEEDED FOR WHEEZE OR SHORTNESS OF BREATH 18 g 0  . Biotin 5 MG CAPS Take 1 capsule by mouth daily.    . LUTEIN PO Take 1 tablet by mouth daily.    . Multiple Vitamin (MULTI-VITAMINS PO) Take by mouth daily.    Marland Kitchen nystatin-triamcinolone (MYCOLOG II) cream Apply 1 application topically 2 (two) times daily. Apply to affected area BID for up to 7 days. 60 g 0  . Omega-3 Fatty Acids (FISH OIL) 1000 MG CAPS Take 1 capsule by mouth daily.    Marland Kitchen XIIDRA 5 % SOLN      No facility-administered medications prior to visit.     Review of Systems:   Constitutional:   No  weight loss, night sweats,  Fevers, chills, fatigue, or  lassitude.  HEENT:   No headaches,  Difficulty swallowing,  Tooth/dental problems, or  Sore throat,                No sneezing, itching, ear ache, nasal congestion, post nasal drip,   CV:  No chest pain,  Orthopnea, PND, swelling in lower extremities, anasarca, dizziness, palpitations, syncope.   GI  No heartburn, indigestion, abdominal pain, nausea, vomiting, diarrhea, change in bowel  habits, loss of appetite, bloody stools.   Resp: No shortness of breath with exertion or at rest.  No excess mucus, no productive cough,  No non-productive cough,  No coughing up of blood.  No change in color of mucus.  No wheezing.  No chest wall deformity  Skin: no rash or lesions.  GU: no dysuria, change in color of urine, no urgency or frequency.  No flank pain, no hematuria   MS:  No joint pain or swelling.  No decreased range of motion.  No back pain.    Physical Exam  BP 110/70 (BP Location: Left Arm, Cuff Size: Normal)   Pulse 84   Temp 98 F (36.7 C) (Temporal)   Ht 5\' 5"  (1.651 m)   Wt 176 lb (79.8 kg)   LMP 06/08/2005 (Approximate)   SpO2 99%   BMI 29.29 kg/m   GEN: A/Ox3; pleasant , NAD, well  nourished    HEENT:  [place]/AT,    NOSE-clear, THROAT-clear, no lesions, no postnasal drip or exudate noted.   NECK:  Supple w/ fair ROM; no JVD; normal carotid impulses w/o bruits; no thyromegaly or nodules palpated; no lymphadenopathy.    RESP  Clear  P & A; w/o, wheezes/ rales/ or rhonchi. no accessory muscle use, no dullness to percussion  CARD:  RRR, no m/r/g, no peripheral edema, pulses intact, no cyanosis or clubbing.  GI:   Soft & nt; nml bowel sounds; no organomegaly or masses detected.   Musco: Warm bil, no deformities or joint swelling noted.   Neuro: alert, no focal deficits noted.    Skin: Warm, no lesions or rashes    Lab Results:   BMET  BNP No results found for: BNP     No flowsheet data found.  No results found for: NITRICOXIDE      Assessment & Plan:   OSA (obstructive sleep apnea) Mild OSA , difficulty tolerating CPAP mask  Will change mask if unable to tolerate consider referral for oral appliance   Plan  Patient Instructions  Continue on CPAP at bedtime Try to wear CPAP each night Goal was to wear for at least 4 to 6 hours each night Work on healthy weight Do not drive if sleepy Order for nasal mask . Trial of airfit N 30 nasal cradle mask If not able to work call back can refer for oral appliance.  Follow-up with Dr. Elsworth Soho in 6  months and As needed          Rexene Edison, NP 02/01/2020

## 2020-02-22 ENCOUNTER — Other Ambulatory Visit: Payer: Self-pay | Admitting: Physician Assistant

## 2020-02-22 MED ORDER — CLOBETASOL PROPIONATE 0.05 % EX OINT: 1.0000 "application " | TOPICAL_OINTMENT | Freq: Two times a day (BID) | CUTANEOUS | 2 refills | Status: DC

## 2020-02-22 NOTE — Telephone Encounter (Signed)
Patient calling for refill of clobetasol ointment. She states that [facility] told her to use what she had and call for refill when needed. She would like this sent to [facility] on [address].

## 2020-02-22 NOTE — Telephone Encounter (Signed)
Left message letting patient know that we I [facility].

## 2020-02-29 ENCOUNTER — Telehealth: Payer: Self-pay | Admitting: Pulmonary Disease

## 2020-02-29 DIAGNOSIS — G4733 Obstructive sleep apnea (adult) (pediatric): Secondary | ICD-10-CM

## 2020-02-29 NOTE — Telephone Encounter (Signed)
Forwarding to [facility] per protocol since order already placed

## 2020-03-01 NOTE — Telephone Encounter (Signed)
Cm sent to [facility] checking on status

## 2020-03-01 NOTE — Telephone Encounter (Signed)
New, Tobey Bride, Allyne Gee, [place]; [name] Georges Mouse Hello,   Order was received, worked and sent to the resupply team on 02/06/20. I have messaged resupply team for update on status. I will let you know when I get a reply.  Thanks,  [employer]

## 2020-03-06 NOTE — Telephone Encounter (Signed)
Sent message to [facility] 

## 2020-03-06 NOTE — Telephone Encounter (Signed)
New, Danielle Arellano, Allyne Gee, [place]; [name], Georges Mouse Order was entered and sent to resupply team. I have no details other than that at this time. I have emailed for details and will let you know as soon as I get a reply.  Thanks

## 2020-03-08 NOTE — Telephone Encounter (Signed)
Tried calling the pt to give update, no answer and no VM

## 2020-03-08 NOTE — Telephone Encounter (Signed)
Lulu Riding; Katherina Right, [place]; Kem Kays Hello,   I received feed back that this patient was choosing a different company to work with. The order was not completed after last conversation with patient. No further details.   Thanks,   [employer]

## 2020-03-14 NOTE — Telephone Encounter (Signed)
Spoke with pt. She is aware of the information that we got from [facility]. Pt would like to switch companies as she did not like their customer service. Order has been placed to send order to [facility]. Nothing further was needed.

## 2020-03-14 NOTE — Telephone Encounter (Signed)
Pt is calling regarding CPAP - states that she still has not heard from [facility]. States that she needs her equipment - please advise CB# [phone]

## 2020-05-10 ENCOUNTER — Other Ambulatory Visit: Payer: BC Managed Care – PPO

## 2020-05-16 ENCOUNTER — Encounter: Payer: BC Managed Care – PPO | Admitting: Physician Assistant

## 2020-06-10 ENCOUNTER — Other Ambulatory Visit: Payer: Self-pay | Admitting: Physician Assistant

## 2020-06-10 DIAGNOSIS — Z0000 Encounter for general adult medical examination without abnormal findings: Secondary | ICD-10-CM

## 2020-06-10 DIAGNOSIS — R7309 Other abnormal glucose: Secondary | ICD-10-CM

## 2020-06-11 ENCOUNTER — Other Ambulatory Visit: Payer: BC Managed Care – PPO

## 2020-06-11 ENCOUNTER — Other Ambulatory Visit: Payer: Self-pay

## 2020-06-11 ENCOUNTER — Other Ambulatory Visit: Payer: Self-pay | Admitting: Physician Assistant

## 2020-06-12 LAB — COMPREHENSIVE METABOLIC PANEL
ALT: 27 IU/L (ref 0–32)
AST: 27 IU/L (ref 0–40)
Albumin/Globulin Ratio: 1.5 (ref 1.2–2.2)
Albumin: 4.1 g/dL (ref 3.8–4.9)
Alkaline Phosphatase: 98 IU/L (ref 44–121)
BUN/Creatinine Ratio: 21 (ref 9–23)
BUN: 15 mg/dL (ref 6–24)
Bilirubin Total: <0.2 mg/dL (ref 0.0–1.2)
CO2: 24 mmol/L (ref 20–29)
Calcium: 9.5 mg/dL (ref 8.7–10.2)
Chloride: 108 mmol/L — ABNORMAL HIGH (ref 96–106)
Creatinine, Ser: 0.73 mg/dL (ref 0.57–1.00)
GFR calc Af Amer: 106 mL/min/{1.73_m2} (ref >59)
GFR calc non Af Amer: 92 mL/min/{1.73_m2} (ref >59)
Globulin, Total: 2.8 g/dL (ref 1.5–4.5)
Glucose: 103 mg/dL — ABNORMAL HIGH (ref 65–99)
Potassium: 4.9 mmol/L (ref 3.5–5.2)
Sodium: 146 mmol/L — ABNORMAL HIGH (ref 134–144)
Total Protein: 6.9 g/dL (ref 6.0–8.5)

## 2020-06-12 LAB — CBC
Hematocrit: 33.7 % — ABNORMAL LOW (ref 34.0–46.6)
Hemoglobin: 10.5 g/dL — ABNORMAL LOW (ref 11.1–15.9)
MCH: 24.7 pg — ABNORMAL LOW (ref 26.6–33.0)
MCHC: 31.2 g/dL — ABNORMAL LOW (ref 31.5–35.7)
MCV: 79 fL (ref 79–97)
Platelets: 288 10*3/uL (ref 150–450)
RBC: 4.25 x10E6/uL (ref 3.77–5.28)
RDW: 15.7 % — ABNORMAL HIGH (ref 11.7–15.4)
WBC: 4.2 10*3/uL (ref 3.4–10.8)

## 2020-06-12 LAB — HEMOGLOBIN A1C
Est. average glucose Bld gHb Est-mCnc: 123 mg/dL
Hgb A1c MFr Bld: 5.9 % — ABNORMAL HIGH (ref 4.8–5.6)

## 2020-06-12 LAB — TSH: TSH: 3.19 u[IU]/mL (ref 0.450–4.500)

## 2020-06-12 LAB — LIPID PANEL
Chol/HDL Ratio: 3.3 ratio (ref 0.0–4.4)
Cholesterol, Total: 164 mg/dL (ref 100–199)
HDL: 50 mg/dL (ref >39)
LDL Chol Calc (NIH): 99 mg/dL (ref 0–99)
Triglycerides: 81 mg/dL (ref 0–149)
VLDL Cholesterol Cal: 15 mg/dL (ref 5–40)

## 2020-06-13 ENCOUNTER — Encounter: Payer: Self-pay | Admitting: Physician Assistant

## 2020-06-13 ENCOUNTER — Other Ambulatory Visit: Payer: Self-pay

## 2020-06-13 ENCOUNTER — Ambulatory Visit (INDEPENDENT_AMBULATORY_CARE_PROVIDER_SITE_OTHER): Payer: BC Managed Care – PPO | Admitting: Physician Assistant

## 2020-06-13 VITALS — BP 120/74 | HR 92 | Ht 65.0 in | Wt 180.1 lb

## 2020-06-13 DIAGNOSIS — Z0000 Encounter for general adult medical examination without abnormal findings: Secondary | ICD-10-CM

## 2020-06-13 DIAGNOSIS — D649 Anemia, unspecified: Secondary | ICD-10-CM | POA: Diagnosis not present

## 2020-06-13 NOTE — Progress Notes (Signed)
Female Physical   Impression and Recommendations:    1. Healthcare maintenance   2. Anemia, unspecified type      1) Anticipatory Guidance: Skin CA prevention-recommend to use sunscreen when outside along with skin surveillance; eating a balanced and modest diet; physical activity at least 25 minutes per day or minimum of 150 min/ week moderate to intense activity.  2) Immunizations / Screenings / Labs:   All immunizations are up-to-date per recommendations or will be updated today if pt allows.    - Patient understands with dental and vision screens they will schedule independently.  -Obtained CBC, CMP, HgA1c, Lipid panel, and TSH when fasting.  Most labs are essentially within normal limits or stable from prior with the exception of mildly increased A1c and CBC indicating anemia. - UTD on mammogram, colonoscopy, Tdap, Shingrix, influenza, hep C and HIV screenings.  3) Weight: Recommend to improve diet habits to improve overall feelings of well being and objective health data. Improve nutrient density of diet through increasing intake of fruits and vegetables and decreasing saturated fats, white flour products and refined sugars.  4) Healthcare maintenance: -Patient reports history of childhood anemia. Denies extreme fatigue, chest pain, palpitations, melena or hematochezia. Will collect an iron panel today and pending results start iron supplement if indicated.  Provided Hemoccult cards to complete. -Recommend to reduce carbohydrates and glucose and increase physical activity. Follow a heart healthy diet. -Follow-up in 1 year for CPE and fasting blood work or sooner if needed.   No orders of the defined types were placed in this encounter.   Orders Placed This Encounter  Procedures  . Ferritin  . B12 and Folate Panel  . Transferrin  . Retic  . Iron and TIBC([facility])     Return in about 1 year (around 06/13/2021) for CPE and FBW.    Gross side effects, risk and  benefits, and alternatives of medications discussed with patient.  Patient is aware that all medications have potential side effects and we are unable to predict every side effect or drug-drug interaction that may occur.  Expresses verbal understanding and consents to current therapy plan and treatment regimen.  F-up preventative CPE in 1 year- this is in addition to any chronic care visits.    Please see orders placed and AVS handed out to patient at the end of our visit for further patient instructions/ counseling done pertaining to today's office visit.   Note:  This note was prepared with assistance of Dragon voice recognition software. Occasional wrong-word or sound-a-like substitutions may have occurred due to the inherent limitations of voice recognition software.     Subjective:     CPE HPI: Danielle Arellano is a 55 y.o. female who presents to [place] at [facility] today for a yearly health maintenance exam.   Health Maintenance Summary  - Reviewed and updated, unless pt declines services.  Last Cologuard or Colonoscopy:    01/14/2015-repeat in 10 years Tobacco History Reviewed:  Y, never a smoker CT scan for screening lung CA: N/A Alcohol and/or drug use:   No concerns; no use Exercise Habits:   Currently not active Dental Home: Yes Eye exams: Yes Female Health:  PAP Smear - last known results:  s/p hysterectomy, followed by OB/GYN STD concerns:   none Birth control method: Hysterectomy Menses regular:  n/a Lumps or breast concerns: none Breast Cancer Family History:  No  Additional concerns beyond health maintenance issues: None    Immunization History  Administered Date(s) Administered  . Influenza,inj,Quad PF,6+ Mos 03/06/2019  . Influenza-Unspecified 03/08/2018, 04/30/2020  . PFIZER SARS-COV-2 Vaccination 08/17/2019, 09/07/2019, 04/30/2020  . Tdap 12/07/2018  . Zoster Recombinat (Shingrix) 12/07/2018, 03/06/2019     Health  Maintenance  Topic Date Due  . PAP SMEAR-Modifier  Never done  . COVID-19 Vaccine (4 - Booster for Pfizer series) 10/28/2020  . MAMMOGRAM  01/09/2021  . COLONOSCOPY (Pts 45-77yr Insurance coverage will need to be confirmed)  01/13/2025  . TETANUS/TDAP  12/06/2028  . INFLUENZA VACCINE  Completed  . Hepatitis C Screening  Completed  . HIV Screening  Completed     Wt Readings from Last 3 Encounters:  06/13/20 180 lb 1.6 oz (81.7 kg)  02/01/20 176 lb (79.8 kg)  01/09/20 176 lb 6.4 oz (80 kg)   BP Readings from Last 3 Encounters:  06/13/20 120/74  02/01/20 110/70  01/09/20 124/72   Pulse Readings from Last 3 Encounters:  06/13/20 92  02/01/20 84  01/09/20 76     Past Medical History:  Diagnosis Date  . Bladder mass 2016   right ureterocele on cystoscopy and CT  . Carpal tunnel syndrome   . Complication of anesthesia    itched post op-not sure what-used nubain  . Elevated hemoglobin A1c 02/14/15   HgbA1C 5.9  . Fibroid 05/08/06   TAH retained OV  . Foot mass, right ganglion 01/22/2012  . 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 Lichen planus [id number] . No pertinent past medical history   . PONV (postoperative nausea and vomiting)    has motion sickness-used a scop patch  . Sleep apnea   . Thyromegaly    Normal thyroid [place] 2014.  .Marland KitchenUreterocele    right      Past Surgical History:  Procedure Laterality Date  . ABDOMINAL HYSTERECTOMY N/A    Phreesia 06/11/2020  . CESAREAN SECTION  04,01  . CESAREAN SECTION N/A    Phreesia 06/11/2020  . DIAGNOSTIC LAPAROSCOPY     mass rt ovary  . MASS EXCISION  01/22/2012   Procedure: EXCISION MASS;  Surgeon: JJohnny Bridge MD;  Location: [facility]  Service: Orthopedics;  Laterality: Right;  Excision mass right dorsal foot  . Myomectomies  10/99   Multiple  . TOTAL ABDOMINAL HYSTERECTOMY  2007   TAH-adhesions      Family History  Problem Relation Age of Onset  . Epilepsy Father   . Stroke Father   .  Arthritis Mother   . Multiple myeloma Brother   . Cancer Brother        multiple myeloma      Social History   Substance and Sexual Activity  Drug Use No  ,   Social History   Substance and Sexual Activity  Alcohol Use Not Currently  . Alcohol/week: 0.0 standard drinks  ,   Social History   Tobacco Use  Smoking Status Never Smoker  Smokeless Tobacco Never Used  ,   Social History   Substance and Sexual Activity  Sexual Activity Yes  . Partners: Male  . Birth control/protection: Surgical   Comment: TAH    Current Outpatient Medications on File Prior to Visit  Medication Sig Dispense Refill  . albuterol (VENTOLIN HFA) 108 (90 Base) MCG/ACT inhaler TAKE 2 PUFFS BY MOUTH EVERY 6 HOURS AS NEEDED FOR WHEEZE OR SHORTNESS OF BREATH 18 g 0  . Biotin 5 MG CAPS Take 1 capsule by mouth daily.    .Marland Kitchen  clobetasol ointment (TEMOVATE) 9.47 % Apply 1 application topically 2 (two) times daily. 60 g 2  . LUTEIN PO Take 1 tablet by mouth daily.    . Multiple Vitamin (MULTI-VITAMINS PO) Take by mouth daily.    Marland Kitchen nystatin-triamcinolone (MYCOLOG II) cream Apply 1 application topically 2 (two) times daily. Apply to affected area BID for up to 7 days. 60 g 0  . Omega-3 Fatty Acids (FISH OIL) 1000 MG CAPS Take 1 capsule by mouth daily.    Marland Kitchen XIIDRA 5 % SOLN      No current facility-administered medications on file prior to visit.    Allergies: Patient has no known allergies.  Review of Systems: General:   Denies fever, chills, unexplained weight loss.  Optho/Auditory:   Denies visual changes, blurred vision/LOV Respiratory:   Denies SOB, DOE more than baseline levels.   Cardiovascular:   Denies chest pain, palpitations, new onset peripheral edema  Gastrointestinal:   Denies nausea, vomiting, diarrhea.  Genitourinary: Denies dysuria, freq/ urgency, flank pain or discharge from genitals.  Endocrine:     Denies hot or cold intolerance, polyuria, polydipsia. Musculoskeletal:   Denies  unexplained myalgias, joint swelling, unexplained arthralgias, gait problems.  Skin:  Denies rash, suspicious lesions Neurological:     Denies dizziness, unexplained weakness, numbness  Psychiatric/Behavioral:   Denies mood changes, suicidal or homicidal ideations, hallucinations    Objective:    Blood pressure 120/74, pulse 92, height _0  (1.651 m), weight 180 lb 1.6 oz (81.7 kg), last menstrual period 06/08/2005, SpO2 99 %. Body mass index is 29.97 kg/m. General Appearance:    Alert, cooperative, no distress, appears stated age  Head:    Normocephalic, without obvious abnormality, atraumatic  Eyes:    PERRL, conjunctiva/corneas clear, EOM's intact, both eyes  Ears:    Normal TM's and external ear canals, both ears  Nose:   Nares normal, septum midline, mucosa normal, no drainage    or sinus tenderness  Throat:   Lips w/o lesion, mucosa moist, and tongue normal; teeth and   gums normal  Neck:   Supple, symmetrical, trachea midline, no adenopathy;    thyroid:  no enlargement/tenderness/nodules; no JVD  Back:     Symmetric, no curvature, ROM normal, no CVA tenderness  Lungs:     Clear to auscultation bilaterally, respirations unlabored, no       Wh/ R/ R  Chest Wall:    No tenderness or gross deformity; normal excursion   Heart:    Regular rate and rhythm, S1 and S2 normal, no murmur, rub   or gallop  Breast Exam:   Deferred to OB/GYN  Abdomen:     Soft, non-tender, bowel sounds active all four quadrants, No  G/R/R, no masses, no organomegaly  Genitalia:   Deferred to OB/GYN  Rectal:   Deferred by pt. Provided patient Hemoccult cards  Extremities:   Extremities normal, atraumatic, no cyanosis or gross edema  Pulses:   2+ and symmetric all extremities  Skin:   Warm, dry, Skin color, texture, turgor normal, no obvious rashes or lesions Psych: No HI/SI, judgement and insight good, Euthymic mood. Full Affect.  Neurologic:   CNII-XII grossly intact, normal strength, sensation and  reflexes throughout

## 2020-06-13 NOTE — Patient Instructions (Signed)

## 2020-06-14 LAB — IRON AND TIBC
Iron Saturation: 10 % — ABNORMAL LOW (ref 15–55)
Iron: 37 ug/dL (ref 27–159)
Total Iron Binding Capacity: 388 ug/dL (ref 250–450)
UIBC: 351 ug/dL (ref 131–425)

## 2020-06-14 LAB — B12 AND FOLATE PANEL
Folate: 17.2 ng/mL (ref >3.0)
Vitamin B-12: 401 pg/mL (ref 232–1245)

## 2020-06-14 LAB — FERRITIN: Ferritin: 8 ng/mL — ABNORMAL LOW (ref 15–150)

## 2020-06-14 LAB — RETICULOCYTES: Retic Ct Pct: 1.2 % (ref 0.6–2.6)

## 2020-06-14 LAB — TRANSFERRIN: Transferrin: 324 mg/dL (ref 192–364)

## 2020-06-25 ENCOUNTER — Other Ambulatory Visit: Payer: Self-pay

## 2020-06-25 ENCOUNTER — Other Ambulatory Visit: Payer: BC Managed Care – PPO

## 2020-06-26 LAB — IFOBT (OCCULT BLOOD)
IFOBT: NEGATIVE
IFOBT: NEGATIVE
IFOBT: NEGATIVE

## 2020-07-31 ENCOUNTER — Telehealth: Payer: Self-pay | Admitting: Physician Assistant

## 2020-07-31 ENCOUNTER — Other Ambulatory Visit: Payer: Self-pay

## 2020-07-31 ENCOUNTER — Ambulatory Visit: Payer: BC Managed Care – PPO | Admitting: Obstetrics and Gynecology

## 2020-07-31 DIAGNOSIS — D649 Anemia, unspecified: Secondary | ICD-10-CM

## 2020-07-31 NOTE — Telephone Encounter (Signed)
  Dear Ms. Dower,   It was nice meeting you! Iron panels and CBC are consistent with iron deficiency anemia and recommend to start taking over-the-counter iron supplement- ferrous sulfate 325 mg by mouth twice daily x 3 months and recommend repeating cbc in 4 weeks (lab visit only). Iron supplement can be taken with orange juice to help with better absorption. Please call the office to schedule lab appointment.   Please feel free to contact the office if you have any further questions or concerns.   Warm regards, Lorrene Reid, PA-C  Written by Lorrene Reid, PA-C on 06/14/2020  1:39 PM EST Seen by patient Landry Mellow on 07/10/2020 12:17 PM

## 2020-07-31 NOTE — Telephone Encounter (Signed)
Patient was in office on January 6th and would like to know her lab results, she states she did not see them on Mychart. Patient was also in office on January 18 th for an ifob and states she did not get results for that either. She also was wondering if she needed to have some labs done per her last visit and all I could see was a CPE and FBW and she thought there might be some iron labs that needed to be done, please advise. Thanks

## 2020-07-31 NOTE — Addendum Note (Signed)
Addended by: Mickel Crow on: 07/31/2020 03:18 PM   Modules accepted: Orders

## 2020-07-31 NOTE — Telephone Encounter (Signed)
Patient advised that she has reviewed labs via mychart. Patient scheduled to come in for CBC per lab results. Patient is aware of the results to the IFOB. AS, CMA

## 2020-08-02 ENCOUNTER — Other Ambulatory Visit: Payer: Self-pay | Admitting: Physician Assistant

## 2020-08-02 DIAGNOSIS — Z0000 Encounter for general adult medical examination without abnormal findings: Secondary | ICD-10-CM

## 2020-08-05 ENCOUNTER — Other Ambulatory Visit: Payer: BC Managed Care – PPO

## 2020-08-09 ENCOUNTER — Other Ambulatory Visit: Payer: BC Managed Care – PPO

## 2020-08-12 ENCOUNTER — Other Ambulatory Visit: Payer: Self-pay

## 2020-08-12 ENCOUNTER — Other Ambulatory Visit: Payer: BC Managed Care – PPO

## 2020-08-12 DIAGNOSIS — Z0000 Encounter for general adult medical examination without abnormal findings: Secondary | ICD-10-CM

## 2020-08-13 LAB — CBC
Hematocrit: 42.5 % (ref 34.0–46.6)
Hemoglobin: 13.6 g/dL (ref 11.1–15.9)
MCH: 27.2 pg (ref 26.6–33.0)
MCHC: 32 g/dL (ref 31.5–35.7)
MCV: 85 fL (ref 79–97)
Platelets: 266 10*3/uL (ref 150–450)
RBC: 5 x10E6/uL (ref 3.77–5.28)
RDW: 19.5 % — ABNORMAL HIGH (ref 11.7–15.4)
WBC: 3.2 10*3/uL — ABNORMAL LOW (ref 3.4–10.8)

## 2020-10-02 NOTE — Progress Notes (Signed)
55 y.o. G2P2 Married [place] American female here for annual exam.    Still having vulvar itching;she uses Mycolog II fairly regularly. Doesn't use Clobetasol on vulva. Using Mycolog daily. No vaginal discharge, burning or odor.   Considering vaginal estrogen cream.  Has lichen planus which is treated with Clobetasol.   Was anemic this year and took iron for 3 months.  See Epic.  A1C is 5.9.   Son graduating from [employer].  Received her Covid booster.  PCP:  Lorrene Reid, PA-C   Patient's last menstrual period was 06/08/2005 (approximate).           Sexually active: Yes.    The current method of family planning is status post hysterectomy.    Exercising: Yes.    not structured exercise, but constantly moving Smoker:  no  Health Maintenance: Pap:  05/2008 Neg History of abnormal Pap:  no MMG: 01-10-20 3D/Lt.Br.poss.asymmetry;Rt.Br.neg. Diag.Lt.Br./Neg/BiRads2/screening 72yr Colonoscopy: 06-25-20 Neg IFOB,  12/2014 normal;next due 12/2024 BMD:   n/a  Result  n/a TDaP:  12-07-18 Gardasil:   no HIV: 09-27-15 NR Hep C:09-27-15 Neg Screening Labs:  PCp.    reports that she has never smoked. She has never used smokeless tobacco. She reports previous alcohol use. She reports that she does not use drugs.  Past Medical History:  Diagnosis Date  . Bladder mass 2016   right ureterocele on cystoscopy and CT  . Carpal tunnel syndrome   . Complication of anesthesia    itched post op-not sure what-used nubain  . Elevated hemoglobin A1c 02/14/15   HgbA1C 5.9  . Fibroid 05/08/06   TAH retained OV  . Foot mass, right ganglion 01/22/2012  . 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 Lichen planus [id number]  . No pertinent past medical history   . PONV (postoperative nausea and vomiting)    has motion sickness-used a scop patch  . Sleep apnea   . Thyromegaly    Normal thyroid US in 2014.  Marland Kitchen Ureterocele    right    Past Surgical History:  Procedure Laterality Date  . ABDOMINAL HYSTERECTOMY N/A     Phreesia 06/11/2020  . CESAREAN SECTION  04,01  . CESAREAN SECTION N/A    Phreesia 06/11/2020  . DIAGNOSTIC LAPAROSCOPY     mass rt ovary  . MASS EXCISION  01/22/2012   Procedure: EXCISION MASS;  Surgeon: Johnny Bridge, MD;  Location: [facility];  Service: Orthopedics;  Laterality: Right;  Excision mass right dorsal foot  . Myomectomies  10/99   Multiple  . TOTAL ABDOMINAL HYSTERECTOMY  2007   TAH-adhesions    Current Outpatient Medications  Medication Sig Dispense Refill  . Biotin 5 MG CAPS Take 1 capsule by mouth daily.    . clobetasol ointment (TEMOVATE) 1.27 % Apply 1 application topically 2 (two) times daily. 60 g 2  . LUTEIN PO Take 1 tablet by mouth daily.    . Multiple Vitamin (MULTI-VITAMINS PO) Take by mouth daily.    Marland Kitchen nystatin-triamcinolone (MYCOLOG II) cream Apply 1 application topically 2 (two) times daily. Apply to affected area BID for up to 7 days. 60 g 0  . Omega-3 Fatty Acids (FISH OIL) 1000 MG CAPS Take 1 capsule by mouth daily.    Marland Kitchen XIIDRA 5 % SOLN      No current facility-administered medications for this visit.    Family History  Problem Relation Age of Onset  . Epilepsy Father   . Stroke Father   .  Arthritis Mother   . Multiple myeloma Brother   . Cancer Brother        multiple myeloma    Review of Systems  Skin:       Vulvar itching  All other systems reviewed and are negative.   Exam:   BP 122/74   Pulse 79   Ht $R'5\' 4"'fW$  (1.626 m)   Wt 179 lb (81.2 kg)   LMP 06/08/2005 (Approximate)   SpO2 99%   BMI 30.73 kg/m     General appearance: alert, cooperative and appears stated age Head: normocephalic, without obvious abnormality, atraumatic Neck: no adenopathy, supple, symmetrical, trachea midline and thyroid normal to inspection and palpation Lungs: clear to auscultation bilaterally Breasts: normal appearance, no masses or tenderness, No nipple retraction or dimpling, No nipple discharge or bleeding, No axillary  adenopathy Heart: regular rate and rhythm Abdomen: soft, non-tender; no masses, no organomegaly Extremities: extremities normal, atraumatic, no cyanosis or edema Skin: skin color, texture, turgor normal. No rashes or lesions Lymph nodes: cervical, supraclavicular, and axillary nodes normal. Neurologic: grossly normal  Pelvic: External genitalia:  no lesions              No abnormal inguinal nodes palpated.              Urethra:  normal appearing urethra with no masses, tenderness or lesions              Bartholins and Skenes: normal                 Vagina: normal appearing vagina with normal color and discharge, no lesions              Cervix: absent              Pap taken: No. Bimanual Exam:  Uterus:  absent              Adnexa: no mass, fullness, tenderness              Rectal exam: Yes.  .  Confirms.              Anus:  normal sphincter tone, no lesions  Chaperone was present for exam.  Assessment:   Well woman visit with normal exam. Status post TAH.  Status post LSO.  Right ovary remains. Ureterocele. Hx thyromegaly. Hx elevated hemoglobin A1C. Lichen planus. Right arm.  Treated with clobetasol.  Chronic vaginitis.    Plan: Mammogram screening discussed. Self breast awareness reviewed. Pap and HR HPV as above. Guidelines for Calcium, Vitamin D, regular exercise program including cardiovascular and weight bearing exercise. Reduce sugar and carbs in diet.  Vaginitis NuSwab sent to [facility].  Rx for triamcinolone ointment 0.5% bid x 2 weeks for a flare and then use twice weekly for maintenance dosing.  Consider vaginal estrogen cream.  I discussed potential effect on breast cancer.    Follow up annually and prn.      After visit summary provided.

## 2020-10-03 ENCOUNTER — Other Ambulatory Visit: Payer: Self-pay

## 2020-10-03 ENCOUNTER — Ambulatory Visit: Payer: BC Managed Care – PPO | Admitting: Obstetrics and Gynecology

## 2020-10-03 ENCOUNTER — Ambulatory Visit (INDEPENDENT_AMBULATORY_CARE_PROVIDER_SITE_OTHER): Payer: BC Managed Care – PPO | Admitting: Obstetrics and Gynecology

## 2020-10-03 ENCOUNTER — Other Ambulatory Visit (HOSPITAL_COMMUNITY)
Admission: RE | Admit: 2020-10-03 | Discharge: 2020-10-03 | Disposition: A | Discharge status: Home / Self Care | Payer: BC Managed Care – PPO | Source: Ambulatory Visit | Attending: Obstetrics and Gynecology | Admitting: Obstetrics and Gynecology

## 2020-10-03 ENCOUNTER — Encounter: Payer: Self-pay | Admitting: Obstetrics and Gynecology

## 2020-10-03 VITALS — BP 122/74 | HR 79 | Ht 64.0 in | Wt 179.0 lb

## 2020-10-03 DIAGNOSIS — N763 Subacute and chronic vulvitis: Secondary | ICD-10-CM

## 2020-10-03 DIAGNOSIS — Z01419 Encounter for gynecological examination (general) (routine) without abnormal findings: Secondary | ICD-10-CM | POA: Diagnosis not present

## 2020-10-03 MED ORDER — TRIAMCINOLONE ACETONIDE 0.5 % EX OINT: 1.0000 "application " | TOPICAL_OINTMENT | Freq: Two times a day (BID) | CUTANEOUS | 1 refills | Status: DC

## 2020-10-03 NOTE — Patient Instructions (Signed)

## 2020-10-04 LAB — CERVICOVAGINAL ANCILLARY ONLY
Bacterial Vaginitis (gardnerella): NEGATIVE
Candida Glabrata: NEGATIVE
Candida Vaginitis: NEGATIVE
Comment: NEGATIVE
Comment: NEGATIVE
Comment: NEGATIVE
Comment: NEGATIVE
Trichomonas: NEGATIVE

## 2020-12-19 ENCOUNTER — Telehealth: Payer: Self-pay | Admitting: Physician Assistant

## 2020-12-19 NOTE — Telephone Encounter (Signed)
Patient is experiencing leg pain and describes it as aches and a nagging pain from her knees down. It is mostly when she is sleeping. Please advise, thanks.

## 2021-01-08 ENCOUNTER — Ambulatory Visit (INDEPENDENT_AMBULATORY_CARE_PROVIDER_SITE_OTHER): Payer: BC Managed Care – PPO | Admitting: Physician Assistant

## 2021-01-08 ENCOUNTER — Encounter: Payer: Self-pay | Admitting: Physician Assistant

## 2021-01-08 ENCOUNTER — Other Ambulatory Visit: Payer: Self-pay

## 2021-01-08 VITALS — BP 117/72 | HR 83 | Temp 98.2°F | Ht 64.0 in | Wt 182.1 lb

## 2021-01-08 DIAGNOSIS — M7918 Myalgia, other site: Secondary | ICD-10-CM

## 2021-01-08 DIAGNOSIS — R7309 Other abnormal glucose: Secondary | ICD-10-CM

## 2021-01-08 DIAGNOSIS — D649 Anemia, unspecified: Secondary | ICD-10-CM

## 2021-01-08 NOTE — Progress Notes (Signed)
Acute Office Visit  Subjective:    Patient ID: Danielle Arellano, female    DOB: [birth year], 55 y.o.   MRN: [id number]  Chief Complaint  Patient presents with   Leg Pain    HPI Patient is in today for c/o dull ache of bother lower extremities x 5-6 months which occurs 2-3 nights per week. Patient denies numbness, tingling, fever, night sweats, injury/trauma or swelling. Patient reports is able to fall asleep without major issues. States does not feel the urge to constantly move her legs. Has noticed on days she is on her feet more her symptoms will be more bothersome.  Past Medical History:  Diagnosis Date   Bladder mass 2016   right ureterocele on cystoscopy and CT   Carpal tunnel syndrome    Complication of anesthesia    itched post op-not sure what-used nubain   Elevated hemoglobin A1c 02/14/15   HgbA1C 5.9   Fibroid 05/08/06   TAH retained OV   Foot mass, right ganglion 01/22/2012   Leukopenia 4/95   benign- annual CBC   Lichen planus 0931   No pertinent past medical history    PONV (postoperative nausea and vomiting)    has motion sickness-used a scop patch   Sleep apnea    Thyromegaly    Normal thyroid US in 2014.   Ureterocele    right    Past Surgical History:  Procedure Laterality Date   ABDOMINAL HYSTERECTOMY N/A    Phreesia 06/11/2020   CESAREAN SECTION  04,01   CESAREAN SECTION N/A    Phreesia 06/11/2020   DIAGNOSTIC LAPAROSCOPY     mass rt ovary   MASS EXCISION  01/22/2012   Procedure: EXCISION MASS;  Surgeon: Johnny Bridge, MD;  Location: [facility];  Service: Orthopedics;  Laterality: Right;  Excision mass right dorsal foot   Myomectomies  10/99   Multiple   TOTAL ABDOMINAL HYSTERECTOMY  2007   TAH-adhesions    Family History  Problem Relation Age of Onset   Epilepsy Father    Stroke Father    Arthritis Mother    Multiple myeloma Brother    Cancer Brother        multiple myeloma    Social History   Socioeconomic  History   Marital status: Married    Spouse name: Not on file   Number of children: Not on file   Years of education: Not on file   Highest education level: Not on file  Occupational History   Not on file  Tobacco Use   Smoking status: Never   Smokeless tobacco: Never  Vaping Use   Vaping Use: Never used  Substance and Sexual Activity   Alcohol use: Not Currently    Alcohol/week: 0.0 standard drinks   Drug use: No   Sexual activity: Yes    Partners: Male    Birth control/protection: Surgical    Comment: TAH  Other Topics Concern   Not on file  Social History Narrative   Not on file   Social Determinants of Health   Financial Resource Strain: Not on file  Food Insecurity: Not on file  Transportation Needs: Not on file  Physical Activity: Not on file  Stress: Not on file  Social Connections: Not on file  Intimate Partner Violence: Not on file    Outpatient Medications Prior to Visit  Medication Sig Dispense Refill   Biotin 5 MG CAPS Take 1 capsule by mouth daily.     clobetasol  ointment (TEMOVATE) 3.81 % Apply 1 application topically 2 (two) times daily. 60 g 2   LUTEIN PO Take 1 tablet by mouth daily.     Multiple Vitamin (MULTI-VITAMINS PO) Take by mouth daily.     Omega-3 Fatty Acids (FISH OIL) 1000 MG CAPS Take 1 capsule by mouth daily.     triamcinolone ointment (KENALOG) 0.5 % Apply 1 application topically 2 (two) times daily. Use for 2 weeks at a time as needed.  Use twice weekly for maintenance dosing. 30 g 1   XIIDRA 5 % SOLN      No facility-administered medications prior to visit.    No Known Allergies  Review of Systems A fourteen system review of systems was performed and found to be positive as per HPI.    Objective:    Physical Exam General:  Well Developed, well nourished, appropriate for stated age.  Neuro:  Alert and oriented,  extra-ocular muscles intact  HEENT:  Normocephalic, atraumatic, neck supple Skin:  no gross rash, warm,  pink. Cardiac:  RRR, S1 S2 Respiratory:  ECTA B/L and A/P, Not using accessory muscles, speaking in full sentences- unlabored. MSK: Good ROM, no tenderness of calves/thighs Extremities/Vascular:  Ext warm, no cyanosis apprec.; cap RF less 2 sec. Spider veins noted, no edema. Psych:  No HI/SI, judgement and insight good, Euthymic mood. Full Affect.  BP 117/72   Pulse 83   Temp 98.2 F (36.8 C)   Ht 5' 4" (1.626 m)   Wt 182 lb 1.6 oz (82.6 kg)   LMP 06/08/2005 (Approximate)   SpO2 98%   BMI 31.26 kg/m  Wt Readings from Last 3 Encounters:  01/08/21 182 lb 1.6 oz (82.6 kg)  10/03/20 179 lb (81.2 kg)  06/13/20 180 lb 1.6 oz (81.7 kg)    Health Maintenance Due  Topic Date Due   Pneumococcal Vaccine 32-31 Years old (1 - PCV) Never done   PAP SMEAR-Modifier  Never done   COVID-19 Vaccine (4 - Booster for Pfizer series) 07/31/2020   INFLUENZA VACCINE  01/06/2021    There are no preventive care reminders to display for this patient.   Lab Results  Component Value Date   TSH 3.190 06/11/2020   Lab Results  Component Value Date   WBC 3.2 (L) 08/12/2020   HGB 13.6 08/12/2020   HCT 42.5 08/12/2020   MCV 85 08/12/2020   PLT 266 08/12/2020   Lab Results  Component Value Date   NA 146 (H) 06/11/2020   K 4.9 06/11/2020   CO2 24 06/11/2020   GLUCOSE 103 (H) 06/11/2020   BUN 15 06/11/2020   CREATININE 0.73 06/11/2020   BILITOT <0.2 06/11/2020   ALKPHOS 98 06/11/2020   AST 27 06/11/2020   ALT 27 06/11/2020   PROT 6.9 06/11/2020   ALBUMIN 4.1 06/11/2020   CALCIUM 9.5 06/11/2020   Lab Results  Component Value Date   CHOL 164 06/11/2020   Lab Results  Component Value Date   HDL 50 06/11/2020   Lab Results  Component Value Date   LDLCALC 99 06/11/2020   Lab Results  Component Value Date   TRIG 81 06/11/2020   Lab Results  Component Value Date   CHOLHDL 3.3 06/11/2020   Lab Results  Component Value Date   HGBA1C 5.9 (H) 06/11/2020       Assessment & Plan:    Problem List Items Addressed This Visit       Other   Elevated hemoglobin A1c  Relevant Orders   HgB A1c   Other Visit Diagnoses     Muscle ache of extremity    -  Primary   Relevant Orders   Comp Met (CMET)   CBC w/Diff   Iron, TIBC and Ferritin Panel   Magnesium   B12 and Folate Panel   TSH   HgB A1c   Anemia, unspecified type       Relevant Orders   CBC w/Diff   Iron, TIBC and Ferritin Panel      Muscle ache of lower extremity: -Discussed with patient potential etiologies including RLS. Unlikely medication side effect. Patient has risk factors for RLS including iron deficiency anemia and prediabetes. Will collect labs for further evaluation. If labs unremarkable, then recommend trial of low dose GABA agent for chronic persistent RLS.  No orders of the defined types were placed in this encounter.   Note:  This note was prepared with assistance of Dragon voice recognition software. Occasional wrong-word or sound-a-like substitutions may have occurred due to the inherent limitations of voice recognition software.  Lorrene Reid, PA-C

## 2021-01-08 NOTE — Patient Instructions (Signed)
Restless Legs Syndrome Restless legs syndrome is a condition that causes uncomfortable feelings or sensations in the legs, especially while sitting or lying down. The sensations usually cause an overwhelming urge to move the legs. The arms can alsosometimes be affected. The condition can range from mild to severe. The symptoms often interfere witha person's ability to sleep. What are the causes? The cause of this condition is not known. What increases the risk? The following factors may make you more likely to develop this condition: Being older than 50. Pregnancy. Being a woman. In general, the condition is more common in women than in men. A family history of the condition. Having iron deficiency. Overuse of caffeine, nicotine, or alcohol. Certain medical conditions, such as kidney disease, Parkinson's disease, or nerve damage. Certain medicines, such as those for high blood pressure, nausea, colds, allergies, depression, and some heart conditions. What are the signs or symptoms? The main symptom of this condition is uncomfortable sensations in the legs, such as: Pulling. Tingling. Prickling. Throbbing. Crawling. Burning. Usually, the sensations: Affect both sides of the body. Are worse when you sit or lie down. Are worse at night. These may wake you up or make it difficult to fall asleep. Make you have a strong urge to move your legs. Are temporarily relieved by moving your legs. The arms can also be affected, but this is rare. People who have this conditionoften have tiredness during the day because of their lack of sleep at night. How is this diagnosed? This condition may be diagnosed based on: Your symptoms. Blood tests. In some cases, you may be monitored in a sleep lab by a specialist (a sleep study). This can detect any disruptions in your sleep. How is this treated? This condition is treated by managing the symptoms. This may include: Lifestyle changes, such as  exercising, using relaxation techniques, and avoiding caffeine, alcohol, or tobacco. Medicines. Anti-seizure medicines may be tried first. Follow these instructions at home: General instructions Take over-the-counter and prescription medicines only as told by your health care provider. Use methods to help relieve the uncomfortable sensations, such as: Massaging your legs. Walking or stretching. Taking a cold or hot bath. Keep all follow-up visits as told by your health care provider. This is important. Lifestyle     Practice good sleep habits. For example, go to bed and get up at the same time every day. Most adults should get 7-9 hours of sleep each night. Exercise regularly. Try to get at least 30 minutes of exercise most days of the week. Practice ways of relaxing, such as yoga or meditation. Avoid caffeine and alcohol. Do not use any products that contain nicotine or tobacco, such as cigarettes and e-cigarettes. If you need help quitting, ask your health care provider. Contact a health care provider if: Your symptoms get worse or they do not improve with treatment. Summary Restless legs syndrome is a condition that causes uncomfortable feelings or sensations in the legs, especially while sitting or lying down. The symptoms often interfere with a person's ability to sleep. This condition is treated by managing the symptoms. You may need to make lifestyle changes or take medicines. This information is not intended to replace advice given to you by your health care provider. Make sure you discuss any questions you have with your healthcare provider. Document Revised: 07/14/2019 Document Reviewed: 06/14/2017 Elsevier Patient Education  2022 [employer]  

## 2021-01-10 LAB — CBC WITH DIFFERENTIAL/PLATELET
Basophils Absolute: 0 10*3/uL (ref 0.0–0.2)
Basos: 1 %
EOS (ABSOLUTE): 0.1 10*3/uL (ref 0.0–0.4)
Eos: 2 %
Hematocrit: 39.4 % (ref 34.0–46.6)
Hemoglobin: 12.1 g/dL (ref 11.1–15.9)
Immature Grans (Abs): 0 10*3/uL (ref 0.0–0.1)
Immature Granulocytes: 0 %
Lymphocytes Absolute: 1.5 10*3/uL (ref 0.7–3.1)
Lymphs: 37 %
MCH: 28.9 pg (ref 26.6–33.0)
MCHC: 30.7 g/dL — ABNORMAL LOW (ref 31.5–35.7)
MCV: 94 fL (ref 79–97)
Monocytes Absolute: 0.4 10*3/uL (ref 0.1–0.9)
Monocytes: 11 %
Neutrophils Absolute: 2 10*3/uL (ref 1.4–7.0)
Neutrophils: 49 %
Platelets: 162 10*3/uL (ref 150–450)
RBC: 4.18 x10E6/uL (ref 3.77–5.28)
RDW: 15 % (ref 11.7–15.4)
WBC: 4 10*3/uL (ref 3.4–10.8)

## 2021-01-10 LAB — COMPREHENSIVE METABOLIC PANEL
ALT: 26 IU/L (ref 0–32)
AST: 24 IU/L (ref 0–40)
Albumin/Globulin Ratio: 1.8 (ref 1.2–2.2)
Albumin: 4.4 g/dL (ref 3.8–4.9)
Alkaline Phosphatase: 85 IU/L (ref 44–121)
BUN/Creatinine Ratio: 13 (ref 9–23)
BUN: 12 mg/dL (ref 6–24)
Bilirubin Total: <0.2 mg/dL (ref 0.0–1.2)
CO2: 27 mmol/L (ref 20–29)
Calcium: 9.9 mg/dL (ref 8.7–10.2)
Chloride: 104 mmol/L (ref 96–106)
Creatinine, Ser: 0.94 mg/dL (ref 0.57–1.00)
Globulin, Total: 2.5 g/dL (ref 1.5–4.5)
Glucose: 105 mg/dL — ABNORMAL HIGH (ref 65–99)
Potassium: 4.4 mmol/L (ref 3.5–5.2)
Sodium: 144 mmol/L (ref 134–144)
Total Protein: 6.9 g/dL (ref 6.0–8.5)
eGFR: 71 mL/min/{1.73_m2} (ref >59)

## 2021-01-10 LAB — IRON,TIBC AND FERRITIN PANEL
Ferritin: 15 ng/mL (ref 15–150)
Iron Saturation: 46 % (ref 15–55)
Iron: 152 ug/dL (ref 27–159)
Total Iron Binding Capacity: 334 ug/dL (ref 250–450)
UIBC: 182 ug/dL (ref 131–425)

## 2021-01-10 LAB — HEMOGLOBIN A1C
Est. average glucose Bld gHb Est-mCnc: 117 mg/dL
Hgb A1c MFr Bld: 5.7 % — ABNORMAL HIGH (ref 4.8–5.6)

## 2021-01-10 LAB — MAGNESIUM: Magnesium: 1.9 mg/dL (ref 1.6–2.3)

## 2021-01-10 LAB — B12 AND FOLATE PANEL
Folate: 16.5 ng/mL (ref >3.0)
Vitamin B-12: 355 pg/mL (ref 232–1245)

## 2021-01-10 LAB — TSH: TSH: 1.69 u[IU]/mL (ref 0.450–4.500)

## 2021-02-05 ENCOUNTER — Other Ambulatory Visit: Payer: Self-pay | Admitting: Obstetrics and Gynecology

## 2021-02-05 DIAGNOSIS — Z1231 Encounter for screening mammogram for malignant neoplasm of breast: Secondary | ICD-10-CM

## 2021-02-06 ENCOUNTER — Telehealth: Payer: Self-pay

## 2021-02-06 NOTE — Telephone Encounter (Signed)
ATC patient to get her scheduled for an appointment with Dr. Elsworth Soho, [facility]  When patient calls back please schedule her for first available with Dr. Elsworth Soho

## 2021-03-17 NOTE — Telephone Encounter (Signed)
Patient has been scheduled  Next Appt With Pulmonology Davonna Belling V. Alva, MD)04/22/2021 at 11:45 AM

## 2021-03-18 ENCOUNTER — Ambulatory Visit
Admission: RE | Admit: 2021-03-18 | Discharge: 2021-03-18 | Disposition: A | Discharge status: Home / Self Care | Payer: BC Managed Care – PPO | Source: Ambulatory Visit | Attending: Obstetrics and Gynecology | Admitting: Obstetrics and Gynecology

## 2021-03-18 DIAGNOSIS — Z1231 Encounter for screening mammogram for malignant neoplasm of breast: Secondary | ICD-10-CM

## 2021-03-26 ENCOUNTER — Ambulatory Visit (INDEPENDENT_AMBULATORY_CARE_PROVIDER_SITE_OTHER): Payer: BC Managed Care – PPO | Admitting: Physician Assistant

## 2021-03-26 ENCOUNTER — Encounter: Payer: Self-pay | Admitting: Physician Assistant

## 2021-03-26 VITALS — Ht 65.0 in | Wt 178.0 lb

## 2021-03-26 DIAGNOSIS — U071 COVID-19: Secondary | ICD-10-CM

## 2021-03-26 MED ORDER — BENZONATATE 100 MG PO CAPS: 100.0000 mg | ORAL_CAPSULE | Freq: Three times a day (TID) | ORAL | 0 refills | Status: DC | PRN

## 2021-03-26 NOTE — Progress Notes (Signed)
Telehealth office visit note for Danielle Reid, PA-C- at Primary Care at [facility]   I connected with current patient today by telephone and verified that I am speaking with the correct person    Location of the patient: Home  Location of the provider: Office - This visit type was conducted due to national recommendations for restrictions regarding the COVID-19 Pandemic (e.g. social distancing) in an effort to limit this patient's exposure and mitigate transmission in our community.    - No physical exam could be performed with this format, beyond that communicated to [place] by the patient/ family members as noted.   - Additionally my office staff/ schedulers were to discuss with the patient that there may be a monetary charge related to this service, depending on their medical insurance.  My understanding is that patient understood and consented to proceed.     _________________________________________________________________________________   History of Present Illness: Patient comes in with complaint of COVID-19 infection.  Patient reports her symptoms started Saturday (4 days ago).  Symptoms included headache, cough, sore throat, and nasal congestion with clear drainage.  Patient states her cough is "in between the stage", not dry or productive.  Felt like she had a fever Saturday or Sunday night because she was hot at night, did not take her temp, none recently.  Denies chest pain, shortness of breath, nausea, vomiting or diarrhea.  Tested positive with an at-home COVID test yesterday.     GAD 7 : Generalized Anxiety Score 01/08/2021  Nervous, Anxious, on Edge 0  Control/stop worrying 0  Worry too much - different things 0  Trouble relaxing 0  Restless 0  Easily annoyed or irritable 0  Afraid - awful might happen 0  Total GAD 7 Score 0    Depression screen [facility] 2/9 03/26/2021 01/08/2021 06/13/2020 12/07/2018 09/29/2018  Decreased Interest 0 0 0 0 0  Down, Depressed, Hopeless 0 0 0  0 0  PHQ - 2 Score 0 0 0 0 0  Altered sleeping 0 0 0 0 0  Tired, decreased energy 0 0 0 0 1  Change in appetite 0 0 0 0 1  Feeling bad or failure about yourself  0 0 0 0 0  Trouble concentrating 0 0 0 0 0  Moving slowly or fidgety/restless 0 0 0 0 0  Suicidal thoughts 0 0 0 0 0  PHQ-9 Score 0 0 0 0 2  Difficult doing work/chores Not difficult at all - - - Not difficult at all      Impression and Recommendations:     1. COVID-19 virus infection    -Discussed with patient management options and prefers to monitor and treat symptoms, not interested on oral antiviral medication at this time. Recommend to take tessalon Perles as needed for cough relief. If symptoms fail to improve or worsen will consider short course of corticosteroid therapy. Recommend warm liquids/honey, cough and sore throat lozenges. Discussed latest CDC isolation guidelines.     - As part of my medical decision making, I reviewed the following data within the [place] History obtained from pt /family, CMA notes reviewed and incorporated if applicable, Labs reviewed, Radiograph/ tests reviewed if applicable and OV notes from prior OV's with me, as well as any other specialists she/he has seen since seeing me last, were all reviewed and used in my medical decision making process today.    - Additionally, when appropriate, discussion had with patient regarding our  treatment plan, and their biases/concerns about that plan were used in my medical decision making today.    - The patient agreed with the plan and demonstrated an understanding of the instructions.   No barriers to understanding were identified.     - The patient was advised to call back or seek an in-person evaluation if the symptoms worsen or if the condition fails to improve as anticipated.   Return if symptoms worsen or fail to improve.    No orders of the defined types were placed in this encounter.   Meds ordered this encounter   Medications   benzonatate (TESSALON PERLES) 100 MG capsule    Sig: Take 1 capsule (100 mg total) by mouth 3 (three) times daily as needed for cough.    Dispense:  20 capsule    Refill:  0    Order Specific Question:   Supervising Provider    Answer:   Beatrice Lecher D [[id number]]    There are no discontinued medications.     Time spent on telephone encounter was 11 minutes.   Note:  This note was prepared with assistance of Dragon voice recognition software. Occasional wrong-word or sound-a-like substitutions may have occurred due to the inherent limitations of voice recognition software.    The [place] was signed into law in 2016 which includes the topic of electronic health records.  This provides immediate access to information in MyChart.  This includes consultation notes, operative notes, office notes, lab results and pathology reports.  If you have any questions about what you read please let us know at your next visit or call us at the office.  We are right here with you.   __________________________________________________________________________________     Patient Care Team    Relationship Specialty Notifications Start End  Danielle Arellano, [place] PCP - General Physician Assistant  06/10/20   Lavonna Monarch, MD Consulting Physician Dermatology  07/13/18   Loney Loh, MD  Dermatology  07/13/18   Nunzio Cobbs, MD Consulting Physician Obstetrics and Gynecology  07/13/18   Virgina Evener, OD Referring Physician Optometry  07/13/18   Juanita Craver, MD Consulting Physician Gastroenterology  07/20/18   Silvestre Gunner, [place], PA-C (Inactive)  Dermatology  11/22/19      -Vitals obtained; medications/ allergies reconciled;  personal medical, social, Sx etc.histories were updated by CMA, reviewed by me and are reflected in chart   Patient Active Problem List   Diagnosis Date Noted   Pain in thoracic spine 02/06/2019   Left chest pressure 12/07/2018    Sinus pressure 09/29/2018   Cough in adult 09/29/2018   OSA (obstructive sleep apnea) 08/04/2018   Abnormal auditory perception of both ears 07/29/2018   Tonsillolith 07/13/2018   Fatigue 07/13/2018   Loud snoring 07/13/2018   Healthcare maintenance 07/13/2018   Donor of stem cell 67/89/3810   History of lichen planus 17/51/0258   Hyperpigmentation 52/77/8242   Lichen planus 35/36/1443   Neoplasm of uncertain behavior of skin 10/26/2015   Laboratory exam ordered as part of routine general medical examination 02/14/2015   Elevated hemoglobin A1c 02/14/2015   Lymphangioma, 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   Biotin 5 MG CAPS Take 1 capsule by mouth daily.   clobetasol ointment (TEMOVATE) 0.05 %  Apply 1 application topically 2 (two) times daily.   LUTEIN PO Take 1 tablet by mouth daily.   Multiple Vitamin (MULTI-VITAMINS PO) Take by mouth daily.   Omega-3 Fatty Acids (FISH OIL) 1000 MG CAPS Take 1 capsule by mouth daily.   triamcinolone ointment (KENALOG) 0.5 % Apply 1 application topically 2 (two) times daily. Use for 2 weeks at a time as needed.  Use twice weekly for maintenance dosing.   XIIDRA 5 % SOLN      Allergies:  No Known Allergies   ROS:  See above HPI for pertinent positives and negatives   Objective:   Height 5\' 5"  (1.651 m), weight 178 lb (80.7 kg), last menstrual period 06/08/2005.  (if some vitals are omitted, this means that patient was UNABLE to obtain them.) General: A & O * 3; sounds in no acute distress; in usual state of health.  Respiratory: speaking in full sentences Psych: insight appears good, mood- appears full

## 2021-03-26 NOTE — Patient Instructions (Signed)
COVID-19: Quarantine and Isolation Quarantine If you were exposed Quarantine and stay away from others when you have been in close contact with someone who has COVID-19. Isolate If you are sick or test positive Isolate when you are sick or when you have COVID-19, even if you don't have symptoms. When to stay home Calculating quarantine The date of your exposure is considered day 0. Day 1 is the first full day after your last contact with a person who has had COVID-19. Stay home and away from other people for at least 5 days. Learn why CDC updated guidance for the general public. IF YOU were exposed to COVID-19 and are NOT  up to dateIF YOU were exposed to COVID-19 and are NOT on COVID-19 vaccinations Quarantine for at least 5 days Stay home Stay home and quarantine for at least 5 full days. Wear a well-fitting mask if you must be around others in your home. Do not travel. Get tested Even if you don't develop symptoms, get tested at least 5 days after you last had close contact with someone with COVID-19. After quarantine Watch for symptoms Watch for symptoms until 10 days after you last had close contact with someone with COVID-19. Avoid travel It is best to avoid travel until a full 10 days after you last had close contact with someone with COVID-19. If you develop symptoms Isolate immediately and get tested. Continue to stay home until you know the results. Wear a well-fitting mask around others. Take precautions until day 10 Wear a well-fitting mask Wear a well-fitting mask for 10 full days any time you are around others inside your home or in public. Do not go to places where you are unable to wear a well-fitting mask. If you must travel during days 6-10, take precautions. Avoid being around people who are more likely to get very sick from COVID-19. IF YOU were exposed to COVID-19 and are  up to dateIF YOU were exposed to COVID-19 and are on COVID-19 vaccinations No  quarantine You do not need to stay home unless you develop symptoms. Get tested Even if you don't develop symptoms, get tested at least 5 days after you last had close contact with someone with COVID-19. Watch for symptoms Watch for symptoms until 10 days after you last had close contact with someone with COVID-19. If you develop symptoms Isolate immediately and get tested. Continue to stay home until you know the results. Wear a well-fitting mask around others. Take precautions until day 10 Wear a well-fitting mask Wear a well-fitting mask for 10 full days any time you are around others inside your home or in public. Do not go to places where you are unable to wear a well-fitting mask. Take precautions if traveling Avoid being around people who are more likely to get very sick from COVID-19. IF YOU were exposed to COVID-19 and had confirmed COVID-19 within the past 90 days (you tested positive using a viral test) No quarantine You do not need to stay home unless you develop symptoms. Watch for symptoms Watch for symptoms until 10 days after you last had close contact with someone with COVID-19. If you develop symptoms Isolate immediately and get tested. Continue to stay home until you know the results. Wear a well-fitting mask around others. Take precautions until day 10 Wear a well-fitting mask Wear a well-fitting mask for 10 full days any time you are around others inside your home or in public. Do not go to places where you are  unable to wear a well-fitting mask. Take precautions if traveling Avoid being around people who are more likely to get very sick from COVID-19. Calculating isolation Day 0 is your first day of symptoms or a positive viral test. Day 1 is the first full day after your symptoms developed or your test specimen was collected. If you have COVID-19 or have symptoms, isolate for at least 5 days. IF YOU tested positive for COVID-19 or have symptoms, regardless of  vaccination status Stay home for at least 5 days Stay home for 5 days and isolate from others in your home. Wear a well-fitting mask if you must be around others in your home. Do not travel. Ending isolation if you had symptoms End isolation after 5 full days if you are fever-free for 24 hours (without the use of fever-reducing medication) and your symptoms are improving. Ending isolation if you did NOT have symptoms End isolation after at least 5 full days after your positive test. If you got very sick from COVID-19 or have a weakened immune system You should isolate for at least 10 days. Consult your doctor before ending isolation. Take precautions until day 10 Wear a well-fitting mask Wear a well-fitting mask for 10 full days any time you are around others inside your home or in public. Do not go to places where you are unable to wear a well-fitting mask. Do not travel Do not travel until a full 10 days after your symptoms started or the date your positive test was taken if you had no symptoms. Avoid being around people who are more likely to get very sick from COVID-19. Definitions Exposure Contact with someone infected with SARS-CoV-2, the virus that causes COVID-19, in a way that increases the likelihood of getting infected with the virus. Close contact A close contact is someone who was less than 6 feet away from an infected person (laboratory-confirmed or a clinical diagnosis) for a cumulative total of 15 minutes or more over a 24-hour period. For example, three individual 5-minute exposures for a total of 15 minutes. People who are exposed to someone with COVID-19 after they completed at least 5 days of isolation are not considered close contacts. Danielle Arellano is a strategy used to prevent transmission of COVID-19 by keeping people who have been in close contact with someone with COVID-19 apart from others. Who does not need to quarantine? If you had close contact with  someone with COVID-19 and you are in one of the following groups, you do not need to quarantine. You are up to date with your COVID-19 vaccines. You had confirmed COVID-19 within the last 90 days (meaning you tested positive using a viral test). If you are up to date with COVID-19 vaccines, you should wear a well-fitting mask around others for 10 days from the date of your last close contact with someone with COVID-19 (the date of last close contact is considered day 0). Get tested at least 5 days after you last had close contact with someone with COVID-19. If you test positive or develop COVID-19 symptoms, isolate from other people and follow recommendations in the Isolation section below. If you tested positive for COVID-19 with a viral test within the previous 90 days and subsequently recovered and remain without COVID-19 symptoms, you do not need to quarantine or get tested after close contact. You should wear a well-fitting mask around others for 10 days from the date of your last close contact with someone with COVID-19 (the date of last  close contact is considered day 0). If you have COVID-19 symptoms, get tested and isolate from other people and follow recommendations in the Isolation section below. Who should quarantine? If you come into close contact with someone with COVID-19, you should quarantine if you are not up to date on COVID-19 vaccines. This includes people who are not vaccinated. What to do for quarantine Stay home and away from other people for at least 5 days (day 0 through day 5) after your last contact with a person who has COVID-19. The date of your exposure is considered day 0. Wear a well-fitting mask when around others at home, if possible. For 10 days after your last close contact with someone with COVID-19, watch for fever (100.38F or greater), cough, shortness of breath, or other COVID-19 symptoms. If you develop symptoms, get tested immediately and isolate until you receive  your test results. If you test positive, follow isolation recommendations. If you do not develop symptoms, get tested at least 5 days after you last had close contact with someone with COVID-19. If you test negative, you can leave your home, but continue to wear a well-fitting mask when around others at home and in public until 10 days after your last close contact with someone with COVID-19. If you test positive, you should isolate for at least 5 days from the date of your positive test (if you do not have symptoms). If you do develop COVID-19 symptoms, isolate for at least 5 days from the date your symptoms began (the date the symptoms started is day 0). Follow recommendations in the isolation section below. If you are unable to get a test 5 days after last close contact with someone with COVID-19, you can leave your home after day 5 if you have been without COVID-19 symptoms throughout the 5-day period. Wear a well-fitting mask for 10 days after your date of last close contact when around others at home and in public. Avoid people who are have weakened immune systems or are more likely to get very sick from COVID-19, and nursing homes and other high-risk settings, until after at least 10 days. If possible, stay away from people you live with, especially people who are at higher risk for getting very sick from COVID-19, as well as others outside your home throughout the full 10 days after your last close contact with someone with COVID-19. If you are unable to quarantine, you should wear a well-fitting mask for 10 days when around others at home and in public. If you are unable to wear a mask when around others, you should continue to quarantine for 10 days. Avoid people who have weakened immune systems or are more likely to get very sick from COVID-19, and nursing homes and other high-risk settings, until after at least 10 days. See additional information about travel. Do not go to places where you are  unable to wear a mask, such as restaurants and some gyms, and avoid eating around others at home and at work until after 10 days after your last close contact with someone with COVID-19. After quarantine Watch for symptoms until 10 days after your last close contact with someone with COVID-19. If you have symptoms, isolate immediately and get tested. Quarantine in high-risk congregate settings In certain congregate settings that have high risk of secondary transmission (such as Systems analyst and detention facilities, homeless shelters, or cruise ships), CDC recommends a 10-day quarantine for residents, regardless of vaccination and booster status. During periods of critical staffing  shortages, facilities may consider shortening the quarantine period for staff to ensure continuity of operations. Decisions to shorten quarantine in these settings should be made in consultation with state, local, tribal, or territorial health departments and should take into consideration the context and characteristics of the facility. CDC's setting-specific guidance provides additional recommendations for these settings. Isolation Isolation is used to separate people with confirmed or suspected COVID-19 from those without COVID-19. People who are in isolation should stay home until it's safe for them to be around others. At home, anyone sick or infected should separate from others, or wear a well-fitting mask when they need to be around others. People in isolation should stay in a specific "sick room" or area and use a separate bathroom if available. Everyone who has presumed or confirmed COVID-19 should stay home and isolate from other people for at least 5 full days (day 0 is the first day of symptoms or the date of the day of the positive viral test for asymptomatic persons). They should wear a mask when around others at home and in public for an additional 5 days. People who are confirmed to have COVID-19 or are showing  symptoms of COVID-19 need to isolate regardless of their vaccination status. This includes: People who have a positive viral test for COVID-19, regardless of whether or not they have symptoms. People with symptoms of COVID-19, including people who are awaiting test results or have not been tested. People with symptoms should isolate even if they do not know if they have been in close contact with someone with COVID-19. What to do for isolation Monitor your symptoms. If you have an emergency warning sign (including trouble breathing), seek emergency medical care immediately. Stay in a separate room from other household members, if possible. Use a separate bathroom, if possible. Take steps to improve ventilation at home, if possible. Avoid contact with other members of the household and pets. Don't share personal household items, like cups, towels, and utensils. Wear a well-fitting mask when you need to be around other people. Learn more about what to do if you are sick and how to notify your contacts. Ending isolation for people who had COVID-19 and had symptoms If you had COVID-19 and had symptoms, isolate for at least 5 days. To calculate your 5-day isolation period, day 0 is your first day of symptoms. Day 1 is the first full day after your symptoms developed. You can leave isolation after 5 full days. You can end isolation after 5 full days if you are fever-free for 24 hours without the use of fever-reducing medication and your other symptoms have improved (Loss of taste and smell may persist for weeks or months after recovery and need not delay the end of isolation). You should continue to wear a well-fitting mask around others at home and in public for 5 additional days (day 6 through day 10) after the end of your 5-day isolation period. If you are unable to wear a mask when around others, you should continue to isolate for a full 10 days. Avoid people who have weakened immune systems or are more  likely to get very sick from COVID-19, and nursing homes and other high-risk settings, until after at least 10 days. If you continue to have fever or your other symptoms have not improved after 5 days of isolation, you should wait to end your isolation until you are fever-free for 24 hours without the use of fever-reducing medication and your other symptoms have improved.  Continue to wear a well-fitting mask through day 10. Contact your healthcare provider if you have questions. See additional information about travel. Do not go to places where you are unable to wear a mask, such as restaurants and some gyms, and avoid eating around others at home and at work until a full 10 days after your first day of symptoms. If an individual has access to a test and wants to test, the best approach is to use an antigen test1 towards the end of the 5-day isolation period. Collect the test sample only if you are fever-free for 24 hours without the use of fever-reducing medication and your other symptoms have improved (loss of taste and smell may persist for weeks or months after recovery and need not delay the end of isolation). If your test result is positive, you should continue to isolate until day 10. If your test result is negative, you can end isolation, but continue to wear a well-fitting mask around others at home and in public until day 10. Follow additional recommendations for masking and avoiding travel as described above. 1As noted in the labeling for authorized over-the counter antigen tests: Negative results should be treated as presumptive. Negative results do not rule out SARS-CoV-2 infection and should not be used as the sole basis for treatment or patient management decisions, including infection control decisions. To improve results, antigen tests should be used twice over a three-day period with at least 24 hours and no more than 48 hours between tests. Note that these recommendations on ending isolation  do not apply to people who are moderately ill or very sick from COVID-19 or have weakened immune systems. See section below for recommendations for when to end isolation for these groups. Ending isolation for people who tested positive for COVID-19 but had no symptoms If you test positive for COVID-19 and never develop symptoms, isolate for at least 5 days. Day 0 is the day of your positive viral test (based on the date you were tested) and day 1 is the first full day after the specimen was collected for your positive test. You can leave isolation after 5 full days. If you continue to have no symptoms, you can end isolation after at least 5 days. You should continue to wear a well-fitting mask around others at home and in public until day 10 (day 6 through day 10). If you are unable to wear a mask when around others, you should continue to isolate for 10 days. Avoid people who have weakened immune systems or are more likely to get very sick from COVID-19, and nursing homes and other high-risk settings, until after at least 10 days. If you develop symptoms after testing positive, your 5-day isolation period should start over. Day 0 is your first day of symptoms. Follow the recommendations above for ending isolation for people who had COVID-19 and had symptoms. See additional information about travel. Do not go to places where you are unable to wear a mask, such as restaurants and some gyms, and avoid eating around others at home and at work until 10 days after the day of your positive test. If an individual has access to a test and wants to test, the best approach is to use an antigen test1 towards the end of the 5-day isolation period. If your test result is positive, you should continue to isolate until day 10. If your test result is positive, you can also choose to test daily and if your test result  is negative, you can end isolation, but continue to wear a well-fitting mask around others at home and in  public until day 10. Follow additional recommendations for masking and avoiding travel as described above. 1As noted in the labeling for authorized over-the counter antigen tests: Negative results should be treated as presumptive. Negative results do not rule out SARS-CoV-2 infection and should not be used as the sole basis for treatment or patient management decisions, including infection control decisions. To improve results, antigen tests should be used twice over a three-day period with at least 24 hours and no more than 48 hours between tests. Ending isolation for people who were moderately or very sick from COVID-19 or have a weakened immune system People who are moderately ill from COVID-19 (experiencing symptoms that affect the lungs like shortness of breath or difficulty breathing) should isolate for 10 days and follow all other isolation precautions. To calculate your 10-day isolation period, day 0 is your first day of symptoms. Day 1 is the first full day after your symptoms developed. If you are unsure if your symptoms are moderate, talk to a healthcare provider for further guidance. People who are very sick from COVID-19 (this means people who were hospitalized or required intensive care or ventilation support) and people who have weakened immune systems might need to isolate at home longer. They may also require testing with a viral test to determine when they can be around others. CDC recommends an isolation period of at least 10 and up to 20 days for people who were very sick from COVID-19 and for people with weakened immune systems. Consult with your healthcare provider about when you can resume being around other people. If you are unsure if your symptoms are severe or if you have a weakened immune system, talk to a healthcare provider for further guidance. People who have a weakened immune system should talk to their healthcare provider about the potential for reduced immune responses to  COVID-19 vaccines and the need to continue to follow current prevention measures (including wearing a well-fitting mask and avoiding crowds and poorly ventilated indoor spaces) to protect themselves against COVID-19 until advised otherwise by their healthcare provider. Close contacts of immunocompromised people--including household members--should also be encouraged to receive all recommended COVID-19 vaccine doses to help protect these people. Isolation in high-risk congregate settings In certain high-risk congregate settings that have high risk of secondary transmission and where it is not feasible to cohort people (such as Systems analyst and detention facilities, homeless shelters, and cruise ships), CDC recommends a 10-day isolation period for residents. During periods of critical staffing shortages, facilities may consider shortening the isolation period for staff to ensure continuity of operations. Decisions to shorten isolation in these settings should be made in consultation with state, local, tribal, or territorial health departments and should take into consideration the context and characteristics of the facility. CDC's setting-specific guidance provides additional recommendations for these settings. This CDC guidance is meant to supplement--not replace--any federal, state, local, territorial, or tribal health and safety laws, rules, and regulations. Recommendations for specific settings These recommendations do not apply to healthcare professionals. For guidance specific to these settings, see Healthcare professionals: Interim Guidance for Optician, dispensing with SARS-CoV-2 Infection or Exposure to SARS-CoV-2 Patients, residents, and visitors to healthcare settings: Interim Infection Prevention and Control Recommendations for Healthcare Personnel During the [place] 2019 (COVID-19) Pandemic Additional setting-specific guidance and recommendations are available. These  recommendations on quarantine and isolation do apply to [place]  settings. Additional guidance is available here: Overview of COVID-19 Quarantine for K-12 Schools Travelers: Travel information and recommendations Congregate facilities and other settings: [employer] for community, work, and school settings Ongoing COVID-19 exposure FAQs I live with someone with COVID-19, but I cannot be separated from them. How do we manage quarantine in this situation? It is very important for people with COVID-19 to remain apart from other people, if possible, even if they are living together. If separation of the person with COVID-19 from others that they live with is not possible, the other people that they live with will have ongoing exposure, meaning they will be repeatedly exposed until that person is no longer able to spread the virus to other people. In this situation, there are precautions you can take to limit the spread of COVID-19: The person with COVID-19 and everyone they live with should wear a well-fitting mask inside the home. If possible, one person should care for the person with COVID-19 to limit the number of people who are in close contact with the infected person. Take steps to protect yourself and others to reduce transmission in the home: Quarantine if you are not up to date with your COVID-19 vaccines. Isolate if you are sick or tested positive for COVID-19, even if you don't have symptoms. Learn more about the public health recommendations for testing, mask use and quarantine of close contacts, like yourself, who have ongoing exposure. These recommendations differ depending on your vaccination status. What should I do if I have ongoing exposure to COVID-19 from someone I live with? Recommendations for this situation depend on your vaccination status: If you are not up to date on COVID-19 vaccines and have ongoing exposure to COVID-19, you should: Begin quarantine immediately and  continue to quarantine throughout the isolation period of the person with COVID-19. Continue to quarantine for an additional 5 days starting the day after the end of isolation for the person with COVID-19. Get tested at least 5 days after the end of isolation of the infected person that lives with them. If you test negative, you can leave the home but should continue to wear a well-fitting mask when around others at home and in public until 10 days after the end of isolation for the person with COVID-19. Isolate immediately if you develop symptoms of COVID-19 or test positive. If you are up to date with COVID-19 vaccines and have ongoing exposure to COVID-19, you should: Get tested at least 5 days after your first exposure. A person with COVID-19 is considered infectious starting 2 days before they develop symptoms, or 2 days before the date of their positive test if they do not have symptoms. Get tested again at least 5 days after the end of isolation for the person with COVID-19. Wear a well-fitting mask when you are around the person with COVID-19, and do this throughout their isolation period. Wear a well-fitting mask around others for 10 days after the infected person's isolation period ends. Isolate immediately if you develop symptoms of COVID-19 or test positive. What should I do if multiple people I live with test positive for COVID-19 at different times? Recommendations for this situation depend on your vaccination status: If you are not up to date with your COVID-19 vaccines, you should: Quarantine throughout the isolation period of any infected person that you live with. Continue to quarantine until 5 days after the end of isolation date for the most recently infected person that lives with you. For example, if  the last day of isolation of the person most recently infected with COVID-19 was June 30, the new 5-day quarantine period starts on July 1. Get tested at least 5 days after the end  of isolation for the most recently infected person that lives with you. Wear a well-fitting mask when you are around any person with COVID-19 while that person is in isolation. Wear a well-fitting mask when you are around other people until 10 days after your last close contact. Isolate immediately if you develop symptoms of COVID-19 or test positive. If you are up to date with your COVID-19 vaccines, you should: Get tested at least 5 days after your first exposure. A person with COVID-19 is considered infectious starting 2 days before they developed symptoms, or 2 days before the date of their positive test if they do not have symptoms. Get tested again at least 5 days after the end of isolation for the most recently infected person that lives with you. Wear a well-fitting mask when you are around any person with COVID-19 while that person is in isolation. Wear a well-fitting mask around others for 10 days after the end of isolation for the most recently infected person that lives with you. For example, if the last day of isolation for the person most recently infected with COVID-19 was June 30, the new 10-day period to wear a well-fitting mask indoors in public starts on July 1. Isolate immediately if you develop symptoms of COVID-19 or test positive. I had COVID-19 and completed isolation. Do I have to quarantine or get tested if someone I live with gets COVID-19 shortly after I completed isolation? No. If you recently completed isolation and someone that lives with you tests positive for the virus that causes COVID-19 shortly after the end of your isolation period, you do not have to quarantine or get tested as long as you do not develop new symptoms. Once all of the people that live together have completed isolation or quarantine, refer to the guidance below for new exposures to COVID-19. If you had COVID-19 in the previous 90 days and then came into close contact with someone with COVID-19, you do  not have to quarantine or get tested if you do not have symptoms. But you should: Wear a well-fitting mask indoors in public for 10 days after your last close contact. Monitor for COVID-19 symptoms for 10 days from the date of your last close contact. Isolate immediately and get tested if symptoms develop. If more than 90 days have passed since your recovery from infection, follow CDC's recommendations for close contacts. These recommendations will differ depending on your vaccination status. 09/04/2020 Content source: [facility] for Immunization and Respiratory Diseases ([facility]), Division of Viral Diseases This information is not intended to replace advice given to you by your health care provider. Make sure you discuss any questions you have with your health care provider. Document Revised: 10/10/2020 Document Reviewed: 10/10/2020 Elsevier Patient Education  [place].

## 2021-03-31 ENCOUNTER — Telehealth: Payer: Self-pay | Admitting: Physician Assistant

## 2021-03-31 DIAGNOSIS — U071 COVID-19: Secondary | ICD-10-CM

## 2021-03-31 MED ORDER — PREDNISONE 20 MG PO TABS: ORAL_TABLET | ORAL | 0 refills | Status: DC

## 2021-03-31 NOTE — Telephone Encounter (Signed)
Patient called office requesting steroid dose pack since she was not feeling better from last Wednesday. Per Herb Grays this is fine. Sending Prednisone 20mg  taper. Left msg advising pt sending med to [facility] on [address], CMA

## 2021-04-22 ENCOUNTER — Other Ambulatory Visit: Payer: Self-pay

## 2021-04-22 ENCOUNTER — Encounter: Payer: Self-pay | Admitting: Pulmonary Disease

## 2021-04-22 ENCOUNTER — Ambulatory Visit (INDEPENDENT_AMBULATORY_CARE_PROVIDER_SITE_OTHER): Payer: BC Managed Care – PPO | Admitting: Pulmonary Disease

## 2021-04-22 VITALS — BP 118/68 | HR 86 | Temp 98.0°F | Ht 65.0 in | Wt 185.6 lb

## 2021-04-22 DIAGNOSIS — G4733 Obstructive sleep apnea (adult) (pediatric): Secondary | ICD-10-CM | POA: Diagnosis not present

## 2021-04-22 NOTE — Progress Notes (Signed)
   Subjective:    Patient ID: Danielle Arellano, female    DOB: [birth year], 55 y.o.   MRN: [id number]  HPI  55 yo retired [employer] for follow-up of mild OSA  Last office visit was 01/2020. Her weight is steady increase slightly from 182 to 185 pounds.  She admits that she rest better when she uses her CPAP machine.  Change in mask to a nasal mask is certainly helped and it is less cumbersome to use but she still has problems using it every night. We reviewed download. Denies any problems with dryness or pressure CPAP download was reviewed which shows average pressure of 11 cm on auto CPAP settings with average usage more than 4 hours every night but a few missed nights in several nights with compliance less than 4 hours.  Events are well controlled and there is minimal leak  Significant tests/ events reviewed  NPSG 10/2018 >> mild OSA, events were only noted during REM sleep, AHI 9/h, REM RDI 32/h  Review of Systems neg for any significant sore throat, dysphagia, itching, sneezing, nasal congestion or excess/ purulent secretions, fever, chills, sweats, unintended wt loss, pleuritic or exertional cp, hempoptysis, orthopnea pnd or change in chronic leg swelling. Also denies presyncope, palpitations, heartburn, abdominal pain, nausea, vomiting, diarrhea or change in bowel or urinary habits, dysuria,hematuria, rash, arthralgias, visual complaints, headache, numbness weakness or ataxia.     Objective:   Physical Exam  Gen. Pleasant, in no distress ENT - no lesions, no post nasal drip Neck: No JVD, no thyromegaly, no carotid bruits Lungs: no use of accessory muscles, no dullness to percussion, decreased without rales or rhonchi  Cardiovascular: Rhythm regular, heart sounds  normal, no murmurs or gallops, no peripheral edema Musculoskeletal: No deformities, no cyanosis or clubbing , no tremors       Assessment & Plan:

## 2021-04-22 NOTE — Assessment & Plan Note (Signed)
We discussed alternative treatment options for mild OSA including dental appliance and hypoglossal nerve stimulation therapy.  She does not want to go to the implant route.  She has a dentist appointment tomorrow and she will discuss dental device with them.  We discussed cost issues involved. Otherwise she will continue to use her CPAP and try to be more consistent.  CPAP supplies will be renewed for a year.  Clearly CPAP does seem to provide more restful sleep and more energy but does seem that it is difficult for her to be compliant 100% of the time and we may simply have to accept this given the mild nature of her disease  Weight loss encouraged, compliance with goal of at least 4-6 hrs every night is the expectation. Advised against medications with sedative side effects Cautioned against driving when sleepy - understanding that sleepiness will vary on a day to day basis

## 2021-04-22 NOTE — Patient Instructions (Signed)
We discussed dental device  inspire implant for OSA CPAP supplies will be renewed x 1 year

## 2021-05-05 ENCOUNTER — Telehealth: Payer: Self-pay | Admitting: Pulmonary Disease

## 2021-05-05 DIAGNOSIS — G4733 Obstructive sleep apnea (adult) (pediatric): Secondary | ICD-10-CM

## 2021-05-05 NOTE — Telephone Encounter (Signed)
Spoke with the pt She states that [place] told her they never received our order for cpap supplies  I see that we did send an order and they received  Forwarding to [facility] per protocol

## 2021-05-05 NOTE — Telephone Encounter (Signed)
Spoke with the pt  She was asking what she needs to take with her to her dentist tomorrow to get fitted for oral device I advised her to call her dentist to see what they require, if anything

## 2021-05-13 NOTE — Telephone Encounter (Signed)
I believe this needs an order for an oral appliance if RA will approve.

## 2021-05-13 NOTE — Telephone Encounter (Signed)
Dr Elsworth Soho, are you okay with referral for oral device?

## 2021-05-13 NOTE — Telephone Encounter (Signed)
Rigoberto Noel, MD to Me     2:42 PM Okay for referral  Referral placed

## 2021-07-02 ENCOUNTER — Ambulatory Visit (INDEPENDENT_AMBULATORY_CARE_PROVIDER_SITE_OTHER): Payer: BC Managed Care – PPO | Admitting: Dermatology

## 2021-07-02 ENCOUNTER — Other Ambulatory Visit: Payer: Self-pay

## 2021-07-02 DIAGNOSIS — L439 Lichen planus, unspecified: Secondary | ICD-10-CM

## 2021-07-02 MED ORDER — TACROLIMUS 0.1 % EX OINT: TOPICAL_OINTMENT | CUTANEOUS | 1 refills | Status: DC

## 2021-07-02 MED ORDER — CLOBETASOL PROP EMOLLIENT BASE 0.05 % EX CREA: 1.0000 "application " | TOPICAL_CREAM | Freq: Two times a day (BID) | CUTANEOUS | 1 refills | Status: AC

## 2021-07-02 NOTE — Patient Instructions (Addendum)
CALL AND UPDATE [place] IN ABOUT 2-3 WEEKS,   OVER THE COUNTER [place]

## 2021-07-21 ENCOUNTER — Encounter: Payer: Self-pay | Admitting: Dermatology

## 2021-07-21 NOTE — Progress Notes (Signed)
° °  Follow-Up Visit   Subjective  Danielle Arellano is a 56 y.o. female who presents for the following: Rash (Rash on the neck, hairloss, toenail fungus/Pt has questions about Lichen Planus - clobetasol refill needed/Dark circles under the eyes, possible spider veins).  Multiple skin, hair, nail areas of concern.  We will focus today on her itchy rash previously diagnosed as lichen planus. Location:  Duration:  Quality:  Associated Signs/Symptoms: Modifying Factors:  Severity:  Timing: Context:   Objective  Well appearing patient in no apparent distress; mood and affect are within normal limits. Left Axilla, Left Genitocrural Fold, Right Axilla, Right Genitocrural Fold, Right Inframammary Fold Small lichenified papules neck, arms, axillae, extremities.  We again discussed the option of biopsies but this is deferred.  I explained that the etiology of lichen planus is uncertain, there is no FDA approved treatment and no cure but many cases do eventually resolve.  She will likely have permanent postinflammatory hyperpigmentation.      A focused examination was performed including head, neck, axillae, upper torso, arms, legs. Relevant physical exam findings are noted in the Assessment and Plan.   Assessment & Plan    Lichen planus Right Inframammary Fold; Left Axilla; Right Axilla; Left Genitocrural Fold; Right Genitocrural Fold  She can continue to use clobetasol except on face or body folds.  Tacrolimus would be preferable.  When there is no visible inflammation or itching, she may not use topical anti-inflammatories.  Related Medications tacrolimus (PROTOPIC) 0.1 % ointment APPLY TO THE AFFECTED AREAS DAILY  Clobetasol Prop Emollient Base (CLOBETASOL PROPIONATE E) 0.05 % emollient cream Apply 1 application topically 2 (two) times daily.      I, Lavonna Monarch, MD, have reviewed all documentation for this visit.  The documentation on 07/21/21 for the exam, diagnosis,  procedures, and orders are all accurate and complete.

## 2021-08-25 ENCOUNTER — Encounter: Payer: Self-pay | Admitting: Physician Assistant

## 2021-08-26 ENCOUNTER — Other Ambulatory Visit: Payer: Self-pay

## 2021-08-26 ENCOUNTER — Ambulatory Visit (INDEPENDENT_AMBULATORY_CARE_PROVIDER_SITE_OTHER): Payer: BC Managed Care – PPO | Admitting: Dermatology

## 2021-08-26 DIAGNOSIS — L304 Erythema intertrigo: Secondary | ICD-10-CM

## 2021-08-26 DIAGNOSIS — L439 Lichen planus, unspecified: Secondary | ICD-10-CM | POA: Diagnosis not present

## 2021-08-26 MED ORDER — CLOBETASOL PROPIONATE 0.05 % EX OINT: 1.0000 "application " | TOPICAL_OINTMENT | Freq: Two times a day (BID) | CUTANEOUS | 2 refills | Status: DC

## 2021-08-26 MED ORDER — TACROLIMUS 0.1 % EX OINT: TOPICAL_OINTMENT | CUTANEOUS | 1 refills | Status: AC

## 2021-08-26 NOTE — Patient Instructions (Signed)
PUT THE OINTMENT IN THE FRIDGE TO GET A LITTLE COLD BEFORE APPLYING TO AFFECTED AREAS  ? ? ?

## 2021-09-08 ENCOUNTER — Encounter: Payer: Self-pay | Admitting: Dermatology

## 2021-09-08 NOTE — Progress Notes (Signed)
? ?  Follow-Up Visit ?  ?Subjective  ?Danielle Arellano is a 56 y.o. female who presents for the following: Follow-up (Pt here for f/u on Lichen Planus on the B/L Axillae, Inframammary folds and  Genitocrural folds ). ? ?History of lichen planus, currently most skin rash is on body folds. ?Location:  ?Duration:  ?Quality:  ?Associated Signs/Symptoms: ?Modifying Factors:  ?Severity:  ?Timing: ?Context:  ? ?Objective  ?Well appearing patient in no apparent distress; mood and affect are within normal limits. ?Patient has a history of lichen planus followed by Dr. Lois Huxley, apparently including biopsy confirmation with linear involvement on axilla/arm.  Past treatment is included Plaquenil and hydroquinone products for significant postinflammatory hyperpigmentation.  Dr. Leonie Green discussed methotrexate with her but her brother had a significant reaction to this medication so she chose to avoid it.  Objectively today there is no sign of lichen planus on skin, nails, mucosa. ? ?Left Axilla, Left Inframammary Fold, Right Axilla, Right Inframammary Fold ?Subtle erythema without pustules or satellite lesions currently better fit irritant intertrigo than lichen planus or inverse psoriasis.  Moderate postinflammatory hyperpigmentation. ? ? ? ?A focused examination was performed including underarms, under breasts, arms, nails, oral mucosa.. Relevant physical exam findings are noted in the Assessment and Plan. ? ? ?Assessment & Plan  ? ? ?Erythema intertrigo ?Left Inframammary Fold; Right Inframammary Fold; Left Axilla; Right Axilla ? ?She is willing to try a noncortisone anti-inflammatory on sensitive areas; prescription for generic Protopic ointment given which she will apply presleep on affected areas for up to 1 month..  If this causes too much burning or stinging she may keep the tube of ointment in the refrigerator.  Follow-up at that time by MyChart or phone. ? ?tacrolimus (PROTOPIC) 0.1 % ointment - Left Axilla,  Left Inframammary Fold, Right Axilla, Right Inframammary Fold ?APPLY TO THE AFFECTED AREAS DAILY ? ?clobetasol ointment (TEMOVATE) 0.05 % - Left Axilla, Left Inframammary Fold, Right Axilla, Right Inframammary Fold ?Apply 1 application. topically 2 (two) times daily. ? ?Lichen planus ? ?I am certainly okay with her using topical clobetasol as needed for inflammatory flares not involving the face or body folds. ? ?Related Medications ?Clobetasol Prop Emollient Base (CLOBETASOL PROPIONATE E) 0.05 % emollient cream ?Apply 1 application topically 2 (two) times daily. ? ? ? ? ? ?I, Lavonna Monarch, MD, have reviewed all documentation for this visit.  The documentation on 09/08/21 for the exam, diagnosis, procedures, and orders are all accurate and complete. ?

## 2021-09-10 ENCOUNTER — Ambulatory Visit (INDEPENDENT_AMBULATORY_CARE_PROVIDER_SITE_OTHER): Payer: BC Managed Care – PPO | Admitting: Physician Assistant

## 2021-09-10 ENCOUNTER — Encounter: Payer: Self-pay | Admitting: Physician Assistant

## 2021-09-10 VITALS — BP 116/73 | HR 88 | Temp 98.2°F | Ht 65.0 in | Wt 184.0 lb

## 2021-09-10 DIAGNOSIS — L509 Urticaria, unspecified: Secondary | ICD-10-CM

## 2021-09-10 NOTE — Patient Instructions (Addendum)
Zyrtec 10 mg twice daily ?Famotidine 20 mg 1-2 times daily  ? ?Hives ?Hives (urticaria) are itchy, red, swollen areas on the skin. Hives can appear on any part of the body. Hives often fade within 24 hours (acute hives). Sometimes, new hives appear after old ones fade and the cycle can continue for several days or weeks (chronic hives). Hives do not spread from person to person (are not contagious). ?Hives come from the body's reaction to something a person is allergic to (allergen), something that causes irritation, or various other triggers. When a person is exposed to a trigger, his or her body releases a chemical (histamine) that causes redness, itching, and swelling. Hives can appear right after exposure to a trigger or hours later. ?What are the causes? ?This condition may be caused by: ?Allergies to foods or ingredients. ?Insect bites or stings. ?Exposure to pollen or pets. ?Spending time in sunlight, heat, or cold (exposure). ?Exercise. ?Stress. ?You can also get hives from other medical conditions and treatments, such as: ?Viruses, including the common cold. ?Bacterial infections, such as urinary tract infections and strep throat. ?Certain medicines. ?Contact with latex or chemicals. ?Allergy shots. ?Blood transfusions. ?Sometimes, the cause of this condition is not known (idiopathic hives). ?What increases the risk? ?You are more likely to develop this condition if you: ?Are a woman. ?Have food allergies, especially to citrus fruits, milk, eggs, peanuts, tree nuts, or shellfish. ?Are allergic to: ?Medicines. ?Latex. ?Insects. ?Animals. ?Pollen. ?What are the signs or symptoms? ?Common symptoms of this condition include raised, itchy, red or white bumps or patches on your skin. These areas may: ?Become large and swollen (welts). ?Change in shape and location, quickly and repeatedly. ?Be separate hives or connect over a large area of skin. ?Sting or become painful. ?Turn white when pressed in the center  (blanch). ?In severe cases, your hands, feet, and face may also become swollen. This may occur if hives develop deeper in your skin. ?How is this diagnosed? ?This condition may be diagnosed by your symptoms, medical history, and physical exam. ?Your skin, urine, or blood may be tested to find out what is causing your hives and to rule out other health issues. ?Your health care provider may also remove a small sample of skin from the affected area and examine it under a microscope (biopsy). ?How is this treated? ?Treatment for this condition depends on the cause and severity of your symptoms. Your health care provider may recommend using cool, wet cloths (cool compresses) or taking cool showers to relieve itching. Treatment may include: ?Medicines that help: ?Relieve itching (antihistamines). ?Reduce swelling (corticosteroids). ?Treat infection (antibiotics). ?An injectable medicine (omalizumab). Your health care provider may prescribe this if you have chronic idiopathic hives and you continue to have symptoms even after treatment with antihistamines. ?Severe cases may require an emergency injection of adrenaline (epinephrine) to prevent a life-threatening allergic reaction (anaphylaxis). ?Follow these instructions at home: ?Medicines ?Take and apply over-the-counter and prescription medicines only as told by your health care provider. ?If you were prescribed an antibiotic medicine, take it as told by your health care provider. Do not stop using the antibiotic even if you start to feel better. ?Skin care ?Apply cool compresses to the affected areas. ?Do not scratch or rub your skin. ?General instructions ?Do not take hot showers or baths. This can make itching worse. ?Do not wear tight-fitting clothing. ?Use sunscreen and wear protective clothing when you are outside. ?Avoid any substances that cause your hives. Keep a journal  to help track what causes your hives. Write down: ?What medicines you take. ?What you eat  and drink. ?What products you use on your skin. ?Keep all follow-up visits as told by your health care provider. This is important. ?Contact a health care provider if: ?Your symptoms are not controlled with medicine. ?Your joints are painful or swollen. ?Get help right away if: ?You have a fever. ?You have pain in your abdomen. ?Your tongue or lips are swollen. ?Your eyelids are swollen. ?Your chest or throat feels tight. ?You have trouble breathing or swallowing. ?These symptoms may represent a serious problem that is an emergency. Do not wait to see if the symptoms will go away. Get medical help right away. Call your local emergency services (911 in the [place] Do not drive yourself to the hospital. ?Summary ?Hives (urticaria) are itchy, red, swollen areas on your skin. Hives come from the body's reaction to something a person is allergic to (allergen), something that causes irritation, or various other triggers. ?Treatment for this condition depends on the cause and severity of your symptoms. ?Avoid any substances that cause your hives. Keep a journal to help track what causes your hives. ?Take and apply over-the-counter and prescription medicines only as told by your health care provider. ?Get help right away if your chest or throat feels tight or if you have trouble breathing or swallowing. ?This information is not intended to replace advice given to you by your health care provider. Make sure you discuss any questions you have with your health care provider. ?Document Revised: 07/14/2020 Document Reviewed: 07/14/2020 ?Elsevier Patient Education ? [place]. ? ?

## 2021-09-10 NOTE — Progress Notes (Signed)
?Established patient acute visit ? ? ?Patient: Danielle Arellano   DOB: [birth year]   56 y.o. Female  MRN: [id number] ?Visit Date: 09/10/2021 ? ?Chief Complaint  ?Patient presents with  ? Acute Visit  ? Rash  ? ?Subjective  ?  ?HPI  ?Patient presents with c/o rash at upper eyelid of both eyes. Reports eyes were very itchy and a little puffy. No fever, facial or tongue swelling, new foods, soaps or facial products. Recently started Zyrtec which has been helping. Patient reports having issues with hives for about 5-6 years and has seen several specialists with different diagnosis including lichen planus and eczema. Has not seen an allergy/asthma specialist. ? ?Medications: ?Outpatient Medications Prior to Visit  ?Medication Sig  ? Biotin 5 MG CAPS Take 1 capsule by mouth daily.  ? clobetasol ointment (TEMOVATE) 0.32 % Apply 1 application. topically 2 (two) times daily.  ? Clobetasol Prop Emollient Base (CLOBETASOL PROPIONATE E) 0.05 % emollient cream Apply 1 application topically 2 (two) times daily.  ? LUTEIN PO Take 1 tablet by mouth daily.  ? Multiple Vitamin (MULTI-VITAMINS PO) Take by mouth daily.  ? Multiple Vitamins-Minerals (MULTIVITAMIN ADULT EXTRA C PO) Multivitamin  ? Omega-3 Fatty Acids (FISH OIL) 1000 MG CAPS Take 1 capsule by mouth daily.  ? tacrolimus (PROTOPIC) 0.1 % ointment APPLY TO THE AFFECTED AREAS DAILY  ? triamcinolone ointment (KENALOG) 0.5 % Apply 1 application topically 2 (two) times daily. Use for 2 weeks at a time as needed.  Use twice weekly for maintenance dosing. (Patient not taking: Reported on 08/26/2021)  ? XIIDRA 5 % SOLN   ? [DISCONTINUED] benzonatate (TESSALON PERLES) 100 MG capsule Take 1 capsule (100 mg total) by mouth 3 (three) times daily as needed for cough.  ? [DISCONTINUED] predniSONE (DELTASONE) 20 MG tablet Take 2 tabs by mouth x 2 days, take 1 tab by mouth x 2 days, take 1/2 tab by mouth x 2 days.  ? ?No facility-administered medications prior to visit.  ? ? ?Review  of Systems ?Review of Systems:  ?A fourteen system review of systems was performed and found to be positive as per HPI. ? ? ?  Objective  ?  ?BP 116/73   Pulse 88   Temp 98.2 ?F (36.8 ?C)   Ht '5\' 5"'$  (1.651 m)   Wt 184 lb (83.5 kg)   LMP 06/08/2005 (Approximate)   SpO2 98%   BMI 30.62 kg/m?  ? ? ?Physical Exam  ?General:  Well Developed, well nourished, appropriate for stated age.  ?Neuro:  Alert and oriented,  extra-ocular muscles intact  ?HEENT:  Normocephalic, atraumatic, neck supple  ?Skin:  slight raised lesions of upper eyelids, no angioedema  ?Cardiac:  RRR, S1 S2 ?Respiratory: CTA B/L  ?Vascular:  Ext warm, no cyanosis apprec.; cap RF less 2 sec. ?Psych:  No HI/SI, judgement and insight good, Euthymic mood. Full Affect. ? ? ?No results found for any visits on 09/10/21. ? Assessment & Plan  ?  ? ?Reviewed recent dermatology consult (08/26/21) and pt being treated for erythema intertrigo and lichen planus. Acute lesions and symptoms are suggestive of urticaria possible related to idiopathic chronic urticaria.. Recommend to continue with Zyrtec and can take twice daily. Also recommend adding H2 blocker such as famotidine 20 mg 1-2 times daily. Recommend taking both medications at least 3-4 weeks to see if rash improves. Also recommend to consider Allergy and Asthma consult. Pt will let me know if needs a referral to place of choice. If  symptoms worsen including itching or eye swelling then will consider starting oral corticosteroid therapy and advised to notify the clinic. Pt verbalized understanding.  ? ? ?Return if symptoms worsen or fail to improve.  ?   ? ? ? ?Lorrene Reid, PA-C  ?[facility] ?[phone] (phone) ?[phone] (fax) ? ?[facility] ?

## 2021-10-08 NOTE — Progress Notes (Signed)
56 y.o. G2P2 Married [place] American female here for annual exam.   ? ?She has chronic itching of the vulva.  ?She has been using triamcinolone and Mycolog in the past, and this is helpful.  ?She has tacrolomis ointment from Dr. Denna Haggard.  Sees him for lichen planus.  ? ?Tried to reduce caffeine and sugar to see if it helps her.  ?Uses detergent that is free and clear.  ?Using Dove sensitive skin soap.  ? ?PCP:  Lorrene Reid, PA-C ? ?Patient's last menstrual period was 06/08/2005 (approximate).     ?  ?    ?Sexually active: Yes.    ?The current method of family planning is status post hysterectomy.    ?Exercising: Yes.     Walking and gardening ?Smoker:  no ? ?Health Maintenance: ?Pap:  2009 Neg ?History of abnormal Pap:  no ?MMG:  03-29-21 Neg/Birads1 ?Colonoscopy:  06-25-20 Neg IFOB,  12/2014 normal;next due 12/2024 ?BMD:  n/a  Result  n/a ?TDaP:  12-07-18 ?Gardasil:   no ?HIV: 09-27-15- NR ?Hep C: 09-27-15 Neg ?Screening Labs:  PCP ? ? reports that she has never smoked. She has never used smokeless tobacco. She reports current alcohol use. She reports that she does not use drugs. ? ?Past Medical History:  ?Diagnosis Date  ? Bladder mass 2016  ? right ureterocele on cystoscopy and CT  ? Carpal tunnel syndrome   ? Complication of anesthesia   ? itched post op-not sure what-used nubain  ? Elevated hemoglobin A1c 02/14/15  ? HgbA1C 5.9  ? Fibroid 05/08/06  ? TAH retained OV  ? Foot mass, right ganglion 01/22/2012  ? Leukopenia 4/95  ? benign- annual CBC  ? Lichen planus 8527  ? No pertinent past medical history   ? PONV (postoperative nausea and vomiting)   ? has motion sickness-used a scop patch  ? Sleep apnea   ? Thyromegaly   ? Normal thyroid US in 2014.  ? Ureterocele   ? right  ? ? ?Past Surgical History:  ?Procedure Laterality Date  ? ABDOMINAL HYSTERECTOMY N/A   ? [facility]rees[facility] 06/11/2020  ? CESAREAN SECTION  04,01  ? CESAREAN SECTION N/A   ? [facility]rees[facility] 06/11/2020  ? DIAGNOSTIC LAPAROSCOPY    ? mass rt ovary  ? MASS EXCISION   01/22/2012  ? Procedure: EXCISION MASS;  Surgeon: Johnny Bridge, MD;  Location: [place];  Service: Orthopedics;  Laterality: Right;  Excision mass right dorsal foot  ? Myomectomies  10/99  ? Multiple  ? TOTAL ABDOMINAL HYSTERECTOMY  2007  ? TAH-adhesions  ? ? ?Current Outpatient Medications  ?Medication Sig Dispense Refill  ? Biotin 5 MG CAPS Take 1 capsule by mouth daily.    ? clobetasol ointment (TEMOVATE) 7.82 % Apply 1 application. topically 2 (two) times daily. 60 g 2  ? Clobetasol Prop Emollient Base (CLOBETASOL PROPIONATE E) 0.05 % emollient cream Apply 1 application topically 2 (two) times daily. 60 g 1  ? LUTEIN PO Take 1 tablet by mouth daily.    ? Multiple Vitamin (MULTI-VITAMINS PO) Take by mouth daily.    ? Omega-3 Fatty Acids (FISH OIL) 1000 MG CAPS Take 1 capsule by mouth daily.    ? tacrolimus (PROTOPIC) 0.1 % ointment APPLY TO THE AFFECTED AREAS DAILY 100 g 1  ? triamcinolone ointment (KENALOG) 0.5 % Apply 1 application topically 2 (two) times daily. Use for 2 weeks at a time as needed.  Use twice weekly for maintenance dosing. (Patient not taking:  Reported on 08/26/2021) 30 g 1  ? XIIDRA 5 % SOLN     ? ?No current facility-administered medications for this visit.  ? ? ?Family History  ?Problem Relation Age of Onset  ? Epilepsy Father   ? Stroke Father   ? Arthritis Mother   ? Multiple myeloma Brother   ? Cancer Brother   ?     multiple myeloma  ? ? ?Review of Systems  ?Genitourinary:  Positive for vaginal pain (vulvar itching).  ?All other systems reviewed and are negative. ? ?Exam:   ?BP 122/70   Pulse 78   Ht $R'5\' 4"'rF$  (1.626 m)   Wt 185 lb (83.9 kg)   LMP 06/08/2005 (Approximate)   SpO2 97%   BMI 31.76 kg/m?     ?General appearance: alert, cooperative and appears stated age ?Head: normocephalic, without obvious abnormality, atraumatic ?Neck: no adenopathy, supple, symmetrical, trachea midline and thyroid normal to inspection and palpation ?Lungs: clear to auscultation  bilaterally ?Breasts: normal appearance, no masses or tenderness, No nipple retraction or dimpling, No nipple discharge or bleeding, No axillary adenopathy ?Heart: regular rate and rhythm ?Abdomen: pannus with mild erythema under fold.  Abdomen is soft, non-tender; no masses, no organomegaly ?Extremities: extremities normal, atraumatic, no cyanosis or edema ?Skin: skin color, texture, turgor normal. No rashes or lesions ?Lymph nodes: cervical, supraclavicular, and axillary nodes normal. ?Neurologic: grossly normal ? ?Pelvic: External genitalia:  no lesions ?             No abnormal inguinal nodes palpated. ?             Urethra:  normal appearing urethra with no masses, tenderness or lesions ?             Bartholins and Skenes: normal    ?             Vagina: normal appearing vagina with normal color and discharge, no lesions ?             Cervix: absent ?             Pap taken: no ?Bimanual Exam:  Uterus:  absent ?             Adnexa: no mass, fullness, tenderness ?             Rectal exam: yes.  Confirms. ?             Anus:  normal sphincter tone, no lesions ? ?Chaperone was present for exam:  Kimalexis, CMA ? ?Assessment:   ?Well woman visit with gynecologic exam. ?Status post TAH.  ?Status post LSO.  ?Right ovary remains.  ?Ureterocele. ?Hx thyromegaly.  ?Lichen planus.  Right arm.  Treated with clobetasol.  ?Chronic vaginitis.   ?Candida of flexural folds of skin. ? ?Plan: ?Mammogram screening discussed. ?Self breast awareness reviewed. ?Pap and HR HPV as above. ?Guidelines for Calcium, Vitamin D, regular exercise program including cardiovascular and weight bearing exercise. ?Return for vulvar biopsy.  Rational explained.  ?Rx for Triamcinolone ointment (instructed in frequency of use) and Nystatin powder.  ?Follow up annually and prn.  ? ?After visit summary provided.  ? ? ? ?

## 2021-10-09 ENCOUNTER — Ambulatory Visit (INDEPENDENT_AMBULATORY_CARE_PROVIDER_SITE_OTHER): Payer: BC Managed Care – PPO | Admitting: Obstetrics and Gynecology

## 2021-10-09 ENCOUNTER — Encounter: Payer: Self-pay | Admitting: Obstetrics and Gynecology

## 2021-10-09 VITALS — BP 122/70 | HR 78 | Ht 64.0 in | Wt 185.0 lb

## 2021-10-09 DIAGNOSIS — N763 Subacute and chronic vulvitis: Secondary | ICD-10-CM | POA: Diagnosis not present

## 2021-10-09 DIAGNOSIS — Z01419 Encounter for gynecological examination (general) (routine) without abnormal findings: Secondary | ICD-10-CM | POA: Diagnosis not present

## 2021-10-09 DIAGNOSIS — B372 Candidiasis of skin and nail: Secondary | ICD-10-CM | POA: Diagnosis not present

## 2021-10-09 MED ORDER — TRIAMCINOLONE ACETONIDE 0.5 % EX OINT
1.0000 | TOPICAL_OINTMENT | Freq: Two times a day (BID) | CUTANEOUS | 0 refills | Status: DC
Start: 2021-10-09 — End: 2022-11-09

## 2021-10-09 MED ORDER — NYSTATIN 100000 UNIT/GM EX POWD: 1.0000 "application " | Freq: Three times a day (TID) | CUTANEOUS | 3 refills | Status: DC

## 2021-10-09 NOTE — Patient Instructions (Signed)

## 2021-10-16 NOTE — Progress Notes (Deleted)
?  Subjective:  ?  ? Patient ID: Danielle Arellano, female   DOB: 20-Dec-1965, 56 y.o.   MRN: [id number] ? ?HPI ? ? ?Review of Systems ? ?   ?Objective:  ? Physical Exam ? ?   ?Assessment:  ?   ?*** ?   ?Plan:  ?   ?*** ?   ?

## 2021-10-16 NOTE — Progress Notes (Signed)
GYNECOLOGY  VISIT ?  ?HPI: ?56 y.o.   Married  [place] American  female   ?G2P2 with Patient's last menstrual period was 06/08/2005 (approximate).   ?here for   vulvar biopsy for chronic vulvitis. ?Uses triamcinolone ointment daily to control symptoms.  ? ?Hx lichen planus of her arm.  ? ?GYNECOLOGIC HISTORY: ?Patient's last menstrual period was 06/08/2005 (approximate). ?Contraception:  TAH  ?Menopausal hormone therapy:  none ?Last mammogram:  03-18-21 density BIRADS 1 negative  ?Last pap smear:   2009 negative  ?       ?OB History   ? ? Gravida  ?2  ? Para  ?2  ? Term  ?   ? Preterm  ?   ? AB  ?   ? Living  ?2  ?  ? ? SAB  ?   ? IAB  ?   ? Ectopic  ?   ? Multiple  ?   ? Live Births  ?   ?   ?  ?  ?    ? ?Patient Active Problem List  ? Diagnosis Date Noted  ? Pain in thoracic spine 02/06/2019  ? Left chest pressure 12/07/2018  ? Sinus pressure 09/29/2018  ? Cough in adult 09/29/2018  ? OSA (obstructive sleep apnea) 08/04/2018  ? Abnormal auditory perception of both ears 07/29/2018  ? Tonsillolith 07/13/2018  ? Fatigue 07/13/2018  ? Loud snoring 07/13/2018  ? Healthcare maintenance 07/13/2018  ? Donor of stem cell 10/23/2016  ? History of lichen planus 17/00/1749  ? Hyperpigmentation 03/12/2016  ? Lichen planus 44/96/7591  ? Neoplasm of uncertain behavior of skin 10/26/2015  ? Laboratory exam ordered as part of routine general medical examination 02/14/2015  ? Elevated hemoglobin A1c 02/14/2015  ? 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 Foot mass, right ganglion 01/22/2012  ? ? ?Past Medical History:  ?Diagnosis Date  ? Bladder mass 2016  ? right ureterocele on cystoscopy and CT  ? Carpal tunnel syndrome   ? Complication of anesthesia   ? itched post op-not sure what-used nubain  ? Elevated hemoglobin A1c 02/14/15  ? HgbA1C 5.9  ? Fibroid 05/08/06  ? TAH retained OV  ? Foot mass, right ganglion 01/22/2012  ? Leukopenia 4/95  ? benign- annual CBC  ? Lichen planus 6384  ? No pertinent past medical history   ? PONV  (postoperative nausea and vomiting)   ? has motion sickness-used a scop patch  ? Sleep apnea   ? Thyromegaly   ? Normal thyroid US in 2014.  ? Ureterocele   ? right  ? ? ?Past Surgical History:  ?Procedure Laterality Date  ? ABDOMINAL HYSTERECTOMY N/A   ? [facility] 06/11/2020  ? CESAREAN SECTION  04,01  ? CESAREAN SECTION N/A   ? [facility] 06/11/2020  ? DIAGNOSTIC LAPAROSCOPY    ? mass rt ovary  ? MASS EXCISION  01/22/2012  ? Procedure: EXCISION MASS;  Surgeon: Johnny Bridge, MD;  Location: [facility];  Service: Orthopedics;  Laterality: Right;  Excision mass right dorsal foot  ? Myomectomies  10/99  ? Multiple  ? TOTAL ABDOMINAL HYSTERECTOMY  2007  ? TAH-adhesions  ? ? ?Current Outpatient Medications  ?Medication Sig Dispense Refill  ? Biotin 5 MG CAPS Take 1 capsule by mouth daily.    ? clobetasol ointment (TEMOVATE) 6.65 % Apply 1 application. topically 2 (two) times daily. 60 g 2  ? Clobetasol Prop Emollient Base (CLOBETASOL PROPIONATE E) 0.05 % emollient cream Apply 1  application topically 2 (two) times daily. 60 g 1  ? LUTEIN PO Take 1 tablet by mouth daily.    ? Multiple Vitamin (MULTI-VITAMINS PO) Take by mouth daily.    ? nystatin (MYCOSTATIN/NYSTOP) powder Apply 1 application. topically 3 (three) times daily. Apply to affected area for up to 7 days 30 g 3  ? Omega-3 Fatty Acids (FISH OIL) 1000 MG CAPS Take 1 capsule by mouth daily.    ? tacrolimus (PROTOPIC) 0.1 % ointment APPLY TO THE AFFECTED AREAS DAILY 100 g 1  ? triamcinolone ointment (KENALOG) 0.5 % Apply 1 application. topically 2 (two) times daily. Use for 2 weeks at a time as needed for vulvar irritation.  Use twice weekly for maintenance dosing. 30 g 0  ? XIIDRA 5 % SOLN     ? ?No current facility-administered medications for this visit.  ?  ? ?ALLERGIES: Patient has no known allergies. ? ?Family History  ?Problem Relation Age of Onset  ? Epilepsy Father   ? Stroke Father   ? Arthritis Mother   ? Multiple myeloma Brother   ? Cancer  Brother   ?     multiple myeloma  ? ? ?Social History  ? ?Socioeconomic History  ? Marital status: Married  ?  Spouse name: Not on file  ? Number of children: Not on file  ? Years of education: Not on file  ? Highest education level: Not on file  ?Occupational History  ? Not on file  ?Tobacco Use  ? Smoking status: Never  ? Smokeless tobacco: Never  ?Vaping Use  ? Vaping Use: Never used  ?Substance and Sexual Activity  ? Alcohol use: Yes  ?  Comment: 2 drinks/month  ? Drug use: No  ? Sexual activity: Yes  ?  Partners: Male  ?  Birth control/protection: Surgical  ?  Comment: TAH  ?Other Topics Concern  ? Not on file  ?Social History Narrative  ? Not on file  ? ?Social Determinants of Health  ? ?Financial Resource Strain: Not on file  ?Food Insecurity: Not on file  ?Transportation Needs: Not on file  ?Physical Activity: Not on file  ?Stress: Not on file  ?Social Connections: Not on file  ?Intimate Partner Violence: Not on file  ? ? ?Review of Systems  ?All other systems reviewed and are negative. ? ?PHYSICAL EXAMINATION:   ? ?BP 128/76 (BP Location: Right Arm, Patient Position: Sitting, Cuff Size: Normal)   Pulse 88   Resp 12   Ht $R'5\' 5"'DY$  (1.651 m)   LMP 06/08/2005 (Approximate)   BMI 30.79 kg/m?     ?General appearance: alert, cooperative and appears stated age ? ?Pelvic: External genitalia:  no lesions ?           ?Vulvar biopsies.  ?Consent for procedure. ?Sterile prep with betadine.  ?Local 1% lidocaine, lot [id number].  ?4 mm punch biopsy of superior left labia majora and inferior right labia majora.  ?Tissue to pathology separately.  ?Single 3/0 vicryl suture of the right labia majora.  ?Silver nitrate to left labia majora.  ?No complications.  ?Minimal EBL. ?Chaperone was present for exam:  Raquel Sarna, RN ? ?ASSESSMENT ? ?Chronic vulvitis.   ? ?PLAN ? ?Fu biopsies.  ?Post biopsy care given.  ?Final plan to follow.  ?She may need to use Clobetasol on the vulva but in a sparing amount.  We discussed the importance of  controlling Clobetasol so it does not thin the tissue or cause adrenal suppression.  ?  ?  An After Visit Summary was printed and given to the patient. ? ? ? ?

## 2021-10-21 ENCOUNTER — Encounter: Payer: Self-pay | Admitting: Obstetrics and Gynecology

## 2021-10-21 ENCOUNTER — Ambulatory Visit (INDEPENDENT_AMBULATORY_CARE_PROVIDER_SITE_OTHER): Payer: BC Managed Care – PPO | Admitting: Obstetrics and Gynecology

## 2021-10-21 ENCOUNTER — Other Ambulatory Visit (HOSPITAL_COMMUNITY)
Admission: RE | Admit: 2021-10-21 | Discharge: 2021-10-21 | Disposition: A | Discharge status: Home / Self Care | Payer: BC Managed Care – PPO | Source: Ambulatory Visit | Attending: Obstetrics and Gynecology | Admitting: Obstetrics and Gynecology

## 2021-10-21 VITALS — BP 128/76 | HR 88 | Resp 12 | Ht 65.0 in

## 2021-10-21 DIAGNOSIS — N763 Subacute and chronic vulvitis: Secondary | ICD-10-CM | POA: Diagnosis present

## 2021-10-21 NOTE — Patient Instructions (Signed)
Vulva Biopsy, Care After The following information offers guidance on how to care for yourself after your procedure. Your health care provider may also give you more specific instructions. If you have problems or questions, contact your health care provider. What can I expect after the procedure? After the procedure, it is common to have: Slight bleeding from the biopsy site. Slight pain or discomfort at the biopsy site. Follow these instructions at home: Biopsy site care  Follow instructions from your health care provider about how to take care of your biopsy site. Make sure you: Clean the area using water and mild soap twice a day or as told by your health care provider. Gently pat the area dry. You may shower 24 hours after the procedure. If you were prescribed an antibiotic ointment, apply it as told by your health care provider. Do not stop using the antibiotic even if your condition improves. If told by your health care provider, take a sitz bath to help with pain and discomfort. This is a warm water bath that you take while sitting down. Do this as often as told by your health care provider. The water should only come up to your hips and cover your buttocks. You may pat the area dry with a soft, clean towel. Leave stitches (sutures), skin glue, or adhesive strips in place. These skin closures may need to stay in place for 2 weeks or longer. If adhesive strip edges start to loosen and curl up, you may trim the loose edges. Do not remove adhesive strips completely unless your health care provider tells you to do that. Check your biopsy site every day for signs of infection. It may be helpful to use a handheld mirror to do this. Check for: Redness, swelling, or more pain. More fluid or blood. Warmth. Pus or a bad smell. Do not rub the biopsy area after urinating. Gently pat the area dry or use a bottle filled with warm water (peri bottle) to clean the area. Gently wipe from front to  back. Lifestyle Wear loose, cotton underwear. Do not wear tight pants. For at least 1 week or until your health care provider approves: Do not use tampons, douche, or put anything inside your vagina. Do not have sex. Until your health care provider approves: Do not exercise, such as running or biking. Do not swim or use a hot tub. General instructions Take over-the-counter and prescription medicines only as told by your health care provider. Drink enough fluid to keep your urine pale yellow. Use a sanitary napkin until the bleeding stops. If told, put ice on the biopsy site. To do this: Place ice in a plastic bag. Place a towel between your skin and the bag. Leave the ice on for 20 minutes, 2-3 times a day. Remove the ice if your skin turns bright red. This is very important. If you cannot feel pain, heat, or cold, you have a greater risk of damage to the area. Keep all follow-up visits. This is important. Contact a health care provider if: You have redness, swelling, or more pain around your biopsy site. You have more fluid or blood coming from your biopsy site. Your biopsy site feels warm to the touch. Your pain is not controlled with medicine or ice packs. You have a fever or chills. Get help right away if: You have heavy bleeding from the vulva. You have pus or a bad smell coming from the biopsy site. You have abdominal pain. Summary After the procedure, it   is common to have slight bleeding and discomfort at the biopsy site. Follow instructions from your health care provider after your biopsy. Take sitz baths as told by your health care provider to help with pain and discomfort. Leave any sutures in place. Contact your health care provider if you notice any signs of infection around the biopsy site, including redness, swelling, more pain, more fluid or blood, or warmth. Keep all follow-up visits. This is important. This information is not intended to replace advice given to you  by your health care provider. Make sure you discuss any questions you have with your health care provider. Document Revised: 02/11/2021 Document Reviewed: 02/11/2021 Elsevier Patient Education  2023 Elsevier Inc.  

## 2021-10-23 LAB — SURGICAL PATHOLOGY

## 2021-11-04 ENCOUNTER — Ambulatory Visit (INDEPENDENT_AMBULATORY_CARE_PROVIDER_SITE_OTHER): Payer: BC Managed Care – PPO | Admitting: Obstetrics and Gynecology

## 2021-11-04 ENCOUNTER — Encounter: Payer: Self-pay | Admitting: Obstetrics and Gynecology

## 2021-11-04 VITALS — BP 110/64 | Ht 65.0 in | Wt 185.0 lb

## 2021-11-04 DIAGNOSIS — Z4802 Encounter for removal of sutures: Secondary | ICD-10-CM

## 2021-11-04 DIAGNOSIS — N763 Subacute and chronic vulvitis: Secondary | ICD-10-CM | POA: Diagnosis not present

## 2021-11-04 NOTE — Progress Notes (Signed)
GYNECOLOGY  VISIT   HPI: 56 y.o.   Married  [place] American  female   G72P2 with Patient's last menstrual period was 06/08/2005 (approximate).   here for suture removal.     Vulvar biopsies showed perivascular dermatitis and acute folliculitis.  Clobetasol ointment recommended.  Patient asking what to do after she has used the Clobetasol ointment twice daily for 2 weeks.   She does see dermatology.   She has considered allergy testing.  GYNECOLOGIC HISTORY: Patient's last menstrual period was 06/08/2005 (approximate). Contraception:  Hyst Menopausal hormone therapy:  none Last mammogram:  03-18-21 Neg/Birads1 Last pap smear:   2009 Neg        OB History     Gravida  2   Para  2   Term      Preterm      AB      Living  2      SAB      IAB      Ectopic      Multiple      Live Births                 Patient Active Problem List   Diagnosis Date Noted   Pain in thoracic spine 02/06/2019   Left chest pressure 12/07/2018   Sinus pressure 09/29/2018   Cough in adult 09/29/2018   OSA (obstructive sleep apnea) 08/04/2018   Abnormal auditory perception of both ears 07/29/2018   Tonsillolith 07/13/2018   Fatigue 07/13/2018   Loud snoring 07/13/2018   Healthcare maintenance 07/13/2018   Donor of stem cell 54/02/8118   History of lichen planus 14/78/2956   Hyperpigmentation 21/30/8657   Lichen planus 84/69/6295   Neoplasm of uncertain behavior of skin 10/26/2015   Laboratory exam ordered as part of routine general medical examination 02/14/2015   Elevated hemoglobin A1c 02/14/2015   Lymphangioma, 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   Foot mass, right ganglion 01/22/2012    Past Medical History:  Diagnosis Date   Bladder mass 2016   right ureterocele on cystoscopy and CT   Carpal tunnel syndrome    Complication of anesthesia    itched post op-not sure what-used nubain   Elevated hemoglobin A1c 02/14/15   HgbA1C 5.9   Fibroid 05/08/06   TAH retained OV   Foot mass,  right ganglion 01/22/2012   Leukopenia 4/95   benign- annual CBC   Lichen planus 2841   No pertinent past medical history    PONV (postoperative nausea and vomiting)    has motion sickness-used a scop patch   Sleep apnea    Thyromegaly    Normal thyroid US in 2014.   Ureterocele    right    Past Surgical History:  Procedure Laterality Date   ABDOMINAL HYSTERECTOMY N/A    [facility]reesia 06/11/2020   CESAREAN SECTION  04,01   CESAREAN SECTION N/A    [facility]reesia 06/11/2020   DIAGNOSTIC LAPAROSCOPY     mass rt ovary   MASS EXCISION  01/22/2012   Procedure: EXCISION MASS;  Surgeon: Johnny Bridge, MD;  Location: [place];  Service: Orthopedics;  Laterality: Right;  Excision mass right dorsal foot   Myomectomies  10/99   Multiple   TOTAL ABDOMINAL HYSTERECTOMY  2007   TAH-adhesions    Current Outpatient Medications  Medication Sig Dispense Refill   Biotin 5 MG CAPS Take 1 capsule by mouth daily.     clobetasol ointment (TEMOVATE) 3.24 % Apply 1 application. topically 2 (two)  times daily. 60 g 2   Clobetasol Prop Emollient Base (CLOBETASOL PROPIONATE E) 0.05 % emollient cream Apply 1 application topically 2 (two) times daily. 60 g 1   LUTEIN PO Take 1 tablet by mouth daily.     Multiple Vitamin (MULTI-VITAMINS PO) Take by mouth daily.     nystatin (MYCOSTATIN/NYSTOP) powder Apply 1 application. topically 3 (three) times daily. Apply to affected area for up to 7 days 30 g 3   Omega-3 Fatty Acids (FISH OIL) 1000 MG CAPS Take 1 capsule by mouth daily.     tacrolimus (PROTOPIC) 0.1 % ointment APPLY TO THE AFFECTED AREAS DAILY 100 g 1   triamcinolone ointment (KENALOG) 0.5 % Apply 1 application. topically 2 (two) times daily. Use for 2 weeks at a time as needed for vulvar irritation.  Use twice weekly for maintenance dosing. 30 g 0   XIIDRA 5 % SOLN      No current facility-administered medications for this visit.     ALLERGIES: Patient has no known allergies.  Family  History  Problem Relation Age of Onset   Epilepsy Father    Stroke Father    Arthritis Mother    Multiple myeloma Brother    Cancer Brother        multiple myeloma    Social History   Socioeconomic History   Marital status: Married    Spouse name: Not on file   Number of children: Not on file   Years of education: Not on file   Highest education level: Not on file  Occupational History   Not on file  Tobacco Use   Smoking status: Never   Smokeless tobacco: Never  Vaping Use   Vaping Use: Never used  Substance and Sexual Activity   Alcohol use: Yes    Comment: 2 drinks/month   Drug use: No   Sexual activity: Yes    Partners: Male    Birth control/protection: Surgical    Comment: TAH  Other Topics Concern   Not on file  Social History Narrative   Not on file   Social Determinants of Health   Financial Resource Strain: Not on file  Food Insecurity: Not on file  Transportation Needs: Not on file  Physical Activity: Not on file  Stress: Not on file  Social Connections: Not on file  Intimate Partner Violence: Not on file    Review of Systems  All other systems reviewed and are negative.  PHYSICAL EXAMINATION:    BP 110/64   Ht $R'5\' 5"'VS$  (1.651 m)   Wt 185 lb (83.9 kg)   LMP 06/08/2005 (Approximate)   BMI 30.79 kg/m     General appearance: alert, cooperative and appears stated age   Pelvic: External genitalia:  left vulvar punch biopsy site, no suture present and wound is still healing.   Right labia majora with suture present at biopsy site, removed.   Chaperone was present for exam:  Estill Bamberg, CMA  ASSESSMENT  Chronic vulvitis.  Encounter for suture removal.   PLAN  Maintenance dosing for clobetasol ointment is twice weekly at hs.  Ok to use Vaseline for hydration of the vulvar skin.  Try bathing using only water and then ok for Dove soap if soap is needed.  Antihistamines reviewed.  She may pursue allergy testing through her dermatologist or her  PCP.  Fu here prn.    An After Visit Summary was printed and given to the patient.  15 min  total time was spent for  this patient encounter, including preparation, face-to-face counseling with the patient, coordination of care, and documentation of the encounter.

## 2022-01-16 ENCOUNTER — Telehealth: Payer: Self-pay | Admitting: Pulmonary Disease

## 2022-01-16 DIAGNOSIS — G4733 Obstructive sleep apnea (adult) (pediatric): Secondary | ICD-10-CM

## 2022-01-16 NOTE — Telephone Encounter (Signed)
Called patient and she states that she is needing the sleep study sent to her dentist office. And a new order for an oral device. I noted that an order was placed last year but I wanted to make sure Dr Elsworth Soho still agreed with oral device.   Sir are you ok with new order for oral device?  Please advise.   And I will get everything sent over to her dentist office if you agree sir.  Thank you

## 2022-01-23 NOTE — Telephone Encounter (Signed)
Referral placed per Dr Angus Palms orders. Patient updated. Nothing further needed

## 2022-03-01 ENCOUNTER — Ambulatory Visit
Admission: RE | Admit: 2022-03-01 | Discharge: 2022-03-01 | Disposition: A | Discharge status: Home / Self Care | Payer: BC Managed Care – PPO | Source: Ambulatory Visit | Attending: Internal Medicine | Admitting: Internal Medicine

## 2022-03-01 VITALS — BP 108/68 | HR 91 | Temp 98.1°F | Resp 18

## 2022-03-01 DIAGNOSIS — J069 Acute upper respiratory infection, unspecified: Secondary | ICD-10-CM

## 2022-03-01 MED ORDER — BENZONATATE 100 MG PO CAPS: 100.0000 mg | ORAL_CAPSULE | Freq: Three times a day (TID) | ORAL | 0 refills | Status: DC | PRN

## 2022-03-01 MED ORDER — PROMETHAZINE-DM 6.25-15 MG/5ML PO SYRP: 5.0000 mL | ORAL_SOLUTION | Freq: Four times a day (QID) | ORAL | 0 refills | Status: DC | PRN

## 2022-03-01 NOTE — ED Triage Notes (Signed)
[facility] presents to [facility] with [facility] of cold cough, sore throat for 4 days, pt has been using old rx from last season for cough and congestion but has since tun out otc covid test neg

## 2022-03-01 NOTE — ED Provider Notes (Signed)
[facility]    CSN: [id number] Arrival date & time: 03/01/22  1257      History   Chief Complaint Chief Complaint  Patient presents with   Cough    HPI Danielle Arellano is a 56 y.o. female.   Patient presents with cough, sore throat, nasal congestion that has been present for about 4 days.  Patient reports nasal congestion and sore throat have basically resolved but that she has a persistent cough.  She has been taking previously prescribed cough medication at home that was beneficial but states that she has run out of these medications.  Denies any known fevers or sick contacts.  Denies chest pain, shortness of breath, nausea, vomiting, diarrhea, abdominal pain.  Patient reports she took a COVID test at home that was negative.   Cough   Past Medical History:  Diagnosis Date   Bladder mass 2016   right ureterocele on cystoscopy and CT   Carpal tunnel syndrome    Complication of anesthesia    itched post op-not sure what-used nubain   Elevated hemoglobin A1c 02/14/15   HgbA1C 5.9   Fibroid 05/08/06   TAH retained OV   Foot mass, right ganglion 01/22/2012   Leukopenia 4/95   benign- annual CBC   Lichen planus 3500   No pertinent past medical history    PONV (postoperative nausea and vomiting)    has motion sickness-used a scop patch   Sleep apnea    Thyromegaly    Normal thyroid US in 2014.   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   Laboratory exam ordered as part of routine general medical examination 02/14/2015   Elevated hemoglobin A1c 02/14/2015   Lymphangioma, 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   Foot mass, right ganglion 01/22/2012    Past Surgical History:  Procedure Laterality Date   ABDOMINAL HYSTERECTOMY N/A    [employer]reesia 06/11/2020   CESAREAN SECTION  04,01   CESAREAN SECTION N/A    [employer]reesia 06/11/2020   DIAGNOSTIC LAPAROSCOPY     mass rt ovary   MASS EXCISION  01/22/2012   Procedure: EXCISION MASS;  Surgeon: Johnny Bridge, MD;  Location: [facility];  Service: Orthopedics;  Laterality: Right;  Excision mass right dorsal foot   Myomectomies  10/99   Multiple   TOTAL ABDOMINAL HYSTERECTOMY  2007   TAH-adhesions    OB History     Gravida  2   Para  2   Term      Preterm      AB      Living  2      SAB      IAB      Ectopic      Multiple      Live Births               Home Medications    Prior to Admission medications  Medication Sig Start Date End Date Taking? Authorizing Provider  benzonatate (TESSALON) 100 MG capsule Take 1 capsule (100 mg total) by mouth every 8 (eight) hours as needed for cough. 03/01/22  Yes Emsley Custer, Michele Rockers, FNP  promethazine-dextromethorphan (PROMETHAZINE-DM) 6.25-15 MG/5ML syrup Take 5 mLs by mouth 4 (four) times daily as needed for cough. 03/01/22  Yes Sujey Gundry, Hildred Alamin E, FNP  Biotin 5 MG CAPS Take 1 capsule by mouth daily.    [provider]  clobetasol ointment (TEMOVATE) 3.29 % Apply 1 application. topically 2 (two) times daily. 08/26/21   Lavonna Monarch, MD  Clobetasol Prop Emollient Base (CLOBETASOL PROPIONATE E) 0.05 % emollient cream Apply 1 application topically 2 (two) times daily. 07/02/21   Lavonna Monarch, MD  LUTEIN PO Take 1 tablet by mouth daily.    [provider]  Multiple Vitamin (MULTI-VITAMINS PO) Take by mouth daily.    [provider]  nystatin (MYCOSTATIN/NYSTOP)  powder Apply 1 application. topically 3 (three) times daily. Apply to affected area for up to 7 days 10/09/21   Nunzio Cobbs, MD  Omega-3 Fatty Acids (FISH OIL) 1000 MG CAPS Take 1 capsule by mouth daily.    [provider]  tacrolimus (PROTOPIC) 0.1 % ointment APPLY TO THE AFFECTED AREAS DAILY 08/26/21   Lavonna Monarch, MD  triamcinolone ointment (KENALOG) 0.5 % Apply 1 application. topically 2 (two) times daily. Use for 2 weeks at a time as needed for vulvar irritation.  Use twice weekly for maintenance dosing. 10/09/21   Nunzio Cobbs, MD  XIIDRA 5 % SOLN  09/29/16   [provider]    Family History Family History  Problem Relation Age of Onset   Epilepsy Father    Stroke Father    Arthritis Mother    Multiple myeloma Brother    Cancer Brother        multiple myeloma    Social History Social History   Tobacco Use   Smoking status: Never   Smokeless tobacco: Never  Vaping Use   Vaping Use: Never used  Substance Use Topics   Alcohol use: Yes    Comment: 2 drinks/month   Drug use: No     Allergies   Patient has no known allergies.   Review of Systems Review of Systems Per HPI  Physical Exam Triage Vital Signs ED Triage Vitals  Enc Vitals Group     BP 03/01/22 1305 108/68     Pulse Rate 03/01/22 1305 91     Resp 03/01/22 1305 18     Temp 03/01/22 1305 98.1 F (36.7 C)     Temp src --      SpO2 03/01/22 1305 98 %     Weight --      Height --      Head Circumference --      Peak Flow --      Pain Score 03/01/22 1304 0     Pain Loc --      Pain Edu? --      Excl. in [place]? --    No data found.  Updated Vital Signs BP 108/68   Pulse 91   Temp 98.1 F (36.7 C)   Resp 18   LMP 06/08/2005 (Approximate)   SpO2 98%   Visual Acuity Right Eye Distance:   Left Eye Distance:   Bilateral Distance:    Right Eye Near:   Left Eye Near:    Bilateral Near:  Physical Exam Constitutional:      General: She is not in  acute distress.    Appearance: Normal appearance. She is not toxic-appearing.  HENT:     Head: Normocephalic and atraumatic.     Right Ear: Tympanic membrane and ear canal normal.     Left Ear: Tympanic membrane and ear canal normal.     Nose: Congestion present.     Mouth/Throat:     Mouth: Mucous membranes are moist.     Pharynx: No posterior oropharyngeal erythema.  Eyes:     Extraocular Movements: Extraocular movements intact.     Conjunctiva/sclera: Conjunctivae normal.     Pupils: Pupils are equal, round, and reactive to light.  Cardiovascular:     Rate and Rhythm: Normal rate and regular rhythm.     Pulses: Normal pulses.     Heart sounds: Normal heart sounds.  Pulmonary:     Effort: Pulmonary effort is normal. No respiratory distress.     Breath sounds: Normal breath sounds. No stridor. No wheezing, rhonchi or rales.  Abdominal:     General: Abdomen is flat. Bowel sounds are normal.     Palpations: Abdomen is soft.  Musculoskeletal:        General: Normal range of motion.     Cervical back: Normal range of motion.  Skin:    General: Skin is warm and dry.  Neurological:     General: No focal deficit present.     Mental Status: She is alert and oriented to person, place, and time. Mental status is at baseline.  Psychiatric:        Mood and Affect: Mood normal.        Behavior: Behavior normal.      UC Treatments / Results  Labs (all labs ordered are listed, but only abnormal results are displayed) Labs Reviewed - No data to display  EKG   Radiology No results found.  Procedures Procedures (including critical care time)  Medications Ordered in UC Medications - No data to display  Initial Impression / Assessment and Plan / UC Course  I have reviewed the triage vital signs and the nursing notes.  Pertinent labs & imaging results that were available during my care of the patient were reviewed by me and considered in my medical decision making (see chart  for details).     Patient presents with symptoms likely from a viral upper respiratory infection. Differential includes bacterial pneumonia, sinusitis, allergic rhinitis, COVID-19, flu. Do not suspect underlying cardiopulmonary process. Symptoms seem unlikely related to ACS, CHF or COPD exacerbations, pneumonia, pneumothorax. Patient is nontoxic appearing and not in need of emergent medical intervention.  Will defer COVID testing as patient had negative COVID test at home.  Recommended symptom control with over the counter medications.  Patient sent medications to alleviate symptoms.  Promethazine DM prescribed for patient to take at night prior to going to bed as needed as it can cause drowsiness.  Called pharmacy to change prescription to at bedtime as needed.  Advised patient medication can cause drowsiness and she voiced understanding.  Return if symptoms fail to improve in 1-2 weeks or you develop shortness of breath, chest pain, severe headache. Patient states understanding and is agreeable.  Discharged with PCP followup.    Final Clinical Impressions(s) / UC Diagnoses   Final diagnoses:  Viral upper respiratory tract infection with cough     Discharge Instructions      It appears that you have a viral upper  respiratory infection that should run its course and self resolve with symptomatic treatment.  I have prescribed you 2 medications for cough.  Promethazine DM is to be taken at night before going to bed as it can cause drowsiness.  Do not drive while taking this medication.  Please follow-up if symptoms persist or worsen.    ED Prescriptions     Medication Sig Dispense Auth. Provider   promethazine-dextromethorphan (PROMETHAZINE-DM) 6.25-15 MG/5ML syrup Take 5 mLs by mouth 4 (four) times daily as needed for cough. 118 mL Tabytha Gradillas, Robert Lee E, [place]   benzonatate (TESSALON) 100 MG capsule Take 1 capsule (100 mg total) by mouth every 8 (eight) hours as needed for cough. 21 capsule  [place], Michele Rockers, [place]      PDMP not reviewed this encounter.   Teodora Medici, [place] 03/01/22 1408

## 2022-03-01 NOTE — Discharge Instructions (Signed)
It appears that you have a viral upper respiratory infection that should run its course and self resolve with symptomatic treatment.  I have prescribed you 2 medications for cough.  Promethazine DM is to be taken at night before going to bed as it can cause drowsiness.  Do not drive while taking this medication.  Please follow-up if symptoms persist or worsen.

## 2022-03-19 ENCOUNTER — Other Ambulatory Visit: Payer: Self-pay | Admitting: Obstetrics and Gynecology

## 2022-03-19 DIAGNOSIS — Z1231 Encounter for screening mammogram for malignant neoplasm of breast: Secondary | ICD-10-CM

## 2022-05-04 ENCOUNTER — Ambulatory Visit
Admission: RE | Admit: 2022-05-04 | Discharge: 2022-05-04 | Disposition: A | Discharge status: Home / Self Care | Payer: BC Managed Care – PPO | Source: Ambulatory Visit | Attending: Obstetrics and Gynecology | Admitting: Obstetrics and Gynecology

## 2022-05-04 DIAGNOSIS — Z1231 Encounter for screening mammogram for malignant neoplasm of breast: Secondary | ICD-10-CM

## 2022-05-07 ENCOUNTER — Telehealth (HOSPITAL_BASED_OUTPATIENT_CLINIC_OR_DEPARTMENT_OTHER): Payer: Self-pay

## 2022-05-07 NOTE — Telephone Encounter (Signed)
Spk to patient will call back to schedule appt with alva

## 2022-06-19 ENCOUNTER — Ambulatory Visit (INDEPENDENT_AMBULATORY_CARE_PROVIDER_SITE_OTHER): Payer: BC Managed Care – PPO | Admitting: Pulmonary Disease

## 2022-06-19 ENCOUNTER — Encounter (HOSPITAL_BASED_OUTPATIENT_CLINIC_OR_DEPARTMENT_OTHER): Payer: Self-pay | Admitting: Pulmonary Disease

## 2022-06-19 VITALS — BP 126/78 | HR 74 | Temp 98.9°F | Ht 65.0 in | Wt 189.8 lb

## 2022-06-19 DIAGNOSIS — G4733 Obstructive sleep apnea (adult) (pediatric): Secondary | ICD-10-CM | POA: Diagnosis not present

## 2022-06-19 NOTE — Patient Instructions (Signed)
X referral to Dr. Oneal Grout for dental appliance.   Meanwhile, try to use your CPAP machine at least 4 hours every night CPAP supplies will be renewed as needed

## 2022-06-19 NOTE — Progress Notes (Signed)
   Subjective:    Patient ID: Danielle Arellano, female    DOB: 04-19-1966, 57 y.o.   MRN: [id number]  HPI 57 yo retired [employer] for follow-up of mild OSA   Chief Complaint  Patient presents with   Follow-up    Pt states no new issues since LOV.    Annual follow-up visit She continues to struggle with her CPAP machine.  She starts off with using it every night but ends up taking it off in a few hours. She continues to have mild somnolence and fatigue during the day.  She is retired but keeps herself busy. Last visit we discussed dental appliance , she contacted her dentist office in [place] but was unable to follow-up with this for some reason, no request referral to another dentist Her 2 brothers also have OSA  Significant tests/ events reviewed  NPSG 10/2018 >> mild OSA, events were only noted during REM sleep, AHI 9/h, REM RDI 32/h    Past Medical History:  Diagnosis Date   Bladder mass 2016   right ureterocele on cystoscopy and CT   Carpal tunnel syndrome    Complication of anesthesia    itched post op-not sure what-used nubain   Elevated hemoglobin A1c 02/14/15   HgbA1C 5.9   Fibroid 05/08/06   TAH retained OV   Foot mass, right ganglion 01/22/2012   Leukopenia 4/95   benign- annual CBC   Lichen planus 6803   No pertinent past medical history    PONV (postoperative nausea and vomiting)    has motion sickness-used a scop patch   Sleep apnea    Thyromegaly    Normal thyroid US in 2014.   Ureterocele    right    Review of Systems neg for any significant sore throat, dysphagia, itching, sneezing, nasal congestion or excess/ purulent secretions, fever, chills, sweats, unintended wt loss, pleuritic or exertional cp, hempoptysis, orthopnea pnd or change in chronic leg swelling. Also denies presyncope, palpitations, heartburn, abdominal pain, nausea, vomiting, diarrhea or change in bowel or urinary habits, dysuria,hematuria, rash,  arthralgias, visual complaints, headache, numbness weakness or ataxia.     Objective:   Physical Exam  Gen. Pleasant, obese, in no distress ENT - no lesions, no post nasal drip Neck: No JVD, no thyromegaly, no carotid bruits Lungs: no use of accessory muscles, no dullness to percussion, decreased without rales or rhonchi  Cardiovascular: Rhythm regular, heart sounds  normal, no murmurs or gallops, no peripheral edema Musculoskeletal: No deformities, no cyanosis or clubbing , no tremors        Assessment & Plan:

## 2022-06-19 NOTE — Assessment & Plan Note (Signed)
OSA was mild, predominantly in REM sleep. We discussed alternative treatment with dental appliance.  Will refer her to dentist in [place] for this. I explained to her that after she has a dental appliance made, she would need a repeat home sleep test to assess efficacy. She would not be a candidate for hypoglossal nerve stimulator implant since OSA is not severe enough. CPAP download was reviewed which shows average compliance of 3 to 4 hours on nights used but good control of events on auto settings with average pressure of 11 cm  Weight loss encouraged, compliance with goal of at least 4-6 hrs every night is the expectation. Advised against medications with sedative side effects Cautioned against driving when sleepy - understanding that sleepiness will vary on a day to day basis

## 2022-07-15 ENCOUNTER — Ambulatory Visit (INDEPENDENT_AMBULATORY_CARE_PROVIDER_SITE_OTHER): Payer: BC Managed Care – PPO

## 2022-07-15 ENCOUNTER — Ambulatory Visit: Payer: BC Managed Care – PPO | Attending: Cardiovascular Disease | Admitting: Cardiovascular Disease

## 2022-07-15 ENCOUNTER — Encounter: Payer: Self-pay | Admitting: Cardiovascular Disease

## 2022-07-15 VITALS — BP 118/70 | HR 70 | Ht 65.0 in | Wt 189.4 lb

## 2022-07-15 DIAGNOSIS — R072 Precordial pain: Secondary | ICD-10-CM

## 2022-07-15 DIAGNOSIS — R002 Palpitations: Secondary | ICD-10-CM

## 2022-07-15 MED ORDER — METOPROLOL TARTRATE 100 MG PO TABS: 100.0000 mg | ORAL_TABLET | Freq: Once | ORAL | 0 refills | Status: DC

## 2022-07-15 NOTE — Progress Notes (Unsigned)
Enrolled for [employer] to mail a ZIO XT long term holter monitor to the patients address on file.  

## 2022-07-15 NOTE — Progress Notes (Signed)
Cardiology Office Note:    Date:  07/15/2022   ID:  Deveron Furlong, DOB [birth year], MRN [id number]  PCP:  Lorrene Reid, [place] Providers Cardiologist:  Elza Varricchio  Click to update primary MD,subspecialty MD or APP then REFRESH:1}    Referring MD: Lorrene Reid, PA-C   Chief Complaint  Patient presents with   Palpitations     History of Present Illness:   Feb. 7, 2024    Danielle Arellano is a 57 y.o. female with a hx of palpitations, OSA   I met her in 2020  We discussed gardening at that visit   Started several months ago  Has OSA,  uses CPAP for the past 3 years  Wakes up with some chest heaviness, not specifically heart attack Moderate exercise, gets some of this chest pressure and DOE walking up a hill   Tolerates the CPAP fairly well ,  is going to try out another oral device in a month  Thinks she has a nerve impingement issue in her back    Fam. Hx  No premature CAD   Past Medical History:  Diagnosis Date   Bladder mass 2016   right ureterocele on cystoscopy and CT   Carpal tunnel syndrome    Complication of anesthesia    itched post op-not sure what-used nubain   Elevated hemoglobin A1c 02/14/15   HgbA1C 5.9   Fibroid 05/08/06   TAH retained OV   Foot mass, right ganglion 01/22/2012   Leukopenia 4/95   benign- annual CBC   Lichen planus 8657   No pertinent past medical history    PONV (postoperative nausea and vomiting)    has motion sickness-used a scop patch   Sleep apnea    Thyromegaly    Normal thyroid US in 2014.   Ureterocele    right    Past Surgical History:  Procedure Laterality Date   ABDOMINAL HYSTERECTOMY N/A    [employer]reesia 06/11/2020   CESAREAN SECTION  04,01   CESAREAN SECTION N/A    [employer]reesia 06/11/2020   DIAGNOSTIC LAPAROSCOPY     mass rt ovary   MASS EXCISION  01/22/2012   Procedure: EXCISION MASS;  Surgeon: Johnny Bridge, MD;  Location: [place];  Service:  Orthopedics;  Laterality: Right;  Excision mass right dorsal foot   Myomectomies  10/99   Multiple   TOTAL ABDOMINAL HYSTERECTOMY  2007   TAH-adhesions    Current Medications: Current Meds  Medication Sig   Biotin 5 MG CAPS Take 1 capsule by mouth daily.   clobetasol ointment (TEMOVATE) 8.46 % Apply 1 application. topically 2 (two) times daily.   Clobetasol Prop Emollient Base (CLOBETASOL PROPIONATE E) 0.05 % emollient cream Apply 1 application topically 2 (two) times daily.   LUTEIN PO Take 1 tablet by mouth daily.   metoprolol tartrate (LOPRESSOR) 100 MG tablet Take 1 tablet (100 mg total) by mouth once for 1 dose. Take 1 tablet (100 mg total) two hours prior to CT scan.   Multiple Vitamin (MULTI-VITAMINS PO) Take by mouth daily.   nystatin (MYCOSTATIN/NYSTOP) powder Apply 1 application. topically 3 (three) times daily. Apply to affected area for up to 7 days   Omega-3 Fatty Acids (FISH OIL) 1000 MG CAPS Take 1 capsule by mouth daily.   tacrolimus (PROTOPIC) 0.1 % ointment APPLY TO THE AFFECTED AREAS DAILY   triamcinolone ointment (KENALOG) 0.5 % Apply 1 application. topically 2 (two) times daily. Use for  2 weeks at a time as needed for vulvar irritation.  Use twice weekly for maintenance dosing.   XIIDRA 5 % SOLN      Allergies:   Patient has no known allergies.   Social History   Socioeconomic History   Marital status: Married    Spouse name: Not on file   Number of children: Not on file   Years of education: Not on file   Highest education level: Not on file  Occupational History   Not on file  Tobacco Use   Smoking status: Never   Smokeless tobacco: Never  Vaping Use   Vaping Use: Never used  Substance and Sexual Activity   Alcohol use: Yes    Comment: 2 drinks/month   Drug use: No   Sexual activity: Yes    Partners: Male    Birth control/protection: Surgical    Comment: TAH  Other Topics Concern   Not on file  Social History Narrative   Not on file   Social  Determinants of Health   Financial Resource Strain: Not on file  Food Insecurity: Not on file  Transportation Needs: Not on file  Physical Activity: Not on file  Stress: Not on file  Social Connections: Not on file     Family History: The patient's family history includes Arthritis in her mother; Cancer in her brother; Epilepsy in her father; Multiple myeloma in her brother; Stroke in her father.  ROS:   Please see the history of present illness.     All other systems reviewed and are negative.  EKGs/Labs/Other Studies Reviewed:    The following studies were reviewed today:   EKG: July 15, 2022: Normal sinus rhythm at 70.  No ST or T wave changes.  Recent Labs: No results found for requested labs within last 365 days.  Recent Lipid Panel    Component Value Date/Time   CHOL 164 06/11/2020 0913   TRIG 81 06/11/2020 0913   HDL 50 06/11/2020 0913   CHOLHDL 3.3 06/11/2020 0913   CHOLHDL 2.8 10/02/2016 1452   VLDL 14 10/02/2016 1452   LDLCALC 99 06/11/2020 0913     Risk Assessment/Calculations:                Physical Exam:    VS:  BP 118/70   Pulse 70   Ht '5\' 5"'$  (1.651 m)   Wt 189 lb 6.4 oz (85.9 kg)   LMP 06/08/2005 (Approximate)   SpO2 98%   BMI 31.52 kg/m     Wt Readings from Last 3 Encounters:  07/15/22 189 lb 6.4 oz (85.9 kg)  06/19/22 189 lb 12.8 oz (86.1 kg)  11/04/21 185 lb (83.9 kg)     GEN:  Well nourished, well developed in no acute distress HEENT: Normal NECK: No JVD; No carotid bruits LYMPHATICS: No lymphadenopathy CARDIAC: RRR, no murmurs, rubs, gallops RESPIRATORY:  Clear to auscultation without rales, wheezing or rhonchi  ABDOMEN: Soft, non-tender, non-distended MUSCULOSKELETAL:  No edema; No deformity  SKIN: Warm and dry NEUROLOGIC:  Alert and oriented x 3 PSYCHIATRIC:  Normal affect   ASSESSMENT:    1. Precordial pain   2. Palpitations    PLAN:    In order of problems listed above:  Palpitations: She has palpitations  that tend to wake her up in the early morning hours.  She has known obstructive sleep apnea but is not able to tolerate her CPAP all that well.  It is possible that she may be having some  episodes of paroxysmal atrial fibrillation. Will put a 14-day event monitor on her primarily to look for any episodes of atrial fibrillation.   2.  Chest pain: She has also some episodes of chest pain and chest discomfort.  These can happen with exertion.  Will get a coronary CT angiogram for further evaluation of this.  3.  Obstructive sleep apnea: She wears her CPAP only about 4 hours a day.  She will be seeing her sleep doctor for further recommendations on a different device.           Medication Adjustments/Labs and Tests Ordered: Current medicines are reviewed at length with the patient today.  Concerns regarding medicines are outlined above.  Orders Placed This Encounter  Procedures   CT CORONARY MORPH W/CTA COR W/SCORE W/CA W/CM &/OR WO/CM   Basic metabolic panel   LONG TERM MONITOR (3-14 DAYS)   EKG 12-Lead   Meds ordered this encounter  Medications   metoprolol tartrate (LOPRESSOR) 100 MG tablet    Sig: Take 1 tablet (100 mg total) by mouth once for 1 dose. Take 1 tablet (100 mg total) two hours prior to CT scan.    Dispense:  1 tablet    Refill:  0    Patient Instructions  Medication Instructions:  Your physician recommends that you continue on your current medications as directed. Please refer to the Current Medication list given to you today.  *If you need a refill on your cardiac medications before your next appointment, please call your pharmacy*   Lab Work: BMET  If you have labs (blood work) drawn today and your tests are completely normal, you will receive your results only by: [place] (if you have MyChart) OR A paper copy in the mail If you have any lab test that is abnormal or we need to change your treatment, we will call you to review the  results.   Testing/Procedures: Your physician has requested that you have cardiac CT. Cardiac computed tomography (CT) is a painless test that uses an x-ray machine to take clear, detailed pictures of your heart. For further information please visit [email or web address].[email or web address]. Please follow instruction sheet as given.   Your physician has recommended that you wear an event monitor. Event monitors are medical devices that record the heart's electrical activity. Doctors most often [place] these monitors to diagnose arrhythmias. Arrhythmias are problems with the speed or rhythm of the heartbeat. The monitor is a small, portable device. You can wear one while you do your normal daily activities. This is usually used to diagnose what is causing palpitations/syncope (passing out).   Follow-Up: At [facility], you and your health needs are our priority.  As part of our continuing mission to provide you with exceptional heart care, we have created designated Provider Care Teams.  These Care Teams include your primary Cardiologist (physician) and Advanced Practice Providers (APPs -  Physician Assistants and Nurse Practitioners) who all work together to provide you with the care you need, when you need it.  We recommend signing up for the patient portal called "MyChart".  Sign up information is provided on this After Visit Summary.  MyChart is used to connect with patients for Virtual Visits (Telemedicine).  Patients are able to view lab/test results, encounter notes, upcoming appointments, etc.  Non-urgent messages can be sent to your provider as well.   To learn more about what you can do with MyChart, go to [email or web address].    Your next appointment:   3  month(s)  Provider:   Dr. Acie Fredrickson  Other Instructions ZIO XT- Long Term Monitor Instructions  Your physician has requested you wear a ZIO patch monitor for 14 days.  This is a single patch monitor. [employer] supplies one patch monitor per  enrollment. Additional stickers are not available. Please do not apply patch if you will be having a Nuclear Stress Test,  Echocardiogram, Cardiac CT, MRI, or Chest Xray during the period you would be wearing the  monitor. The patch cannot be worn during these tests. You cannot remove and re-apply the  ZIO XT patch monitor.  Your ZIO patch monitor will be mailed 3 day USPS to your address on file. It may take 3-5 days  to receive your monitor after you have been enrolled.  Once you have received your monitor, please review the enclosed instructions. Your monitor  has already been registered assigning a specific monitor serial # to you.  Billing and Patient Assistance Program Information  We have supplied [employer] with any of your insurance information on file for billing purposes. [employer] offers a sliding scale Patient Assistance Program for patients that do not have  insurance, or whose insurance does not completely cover the cost of the ZIO monitor.  You must apply for the Patient Assistance Program to qualify for this discounted rate.  To apply, please call [employer] at [phone], select option 4, select option 2, ask to apply for  Patient Assistance Program. Theodore Demark will ask your household income, and how many people  are in your household. They will quote your out-of-pocket cost based on that information.  [employer] will also be able to set up a 57-month interest-free payment plan if needed.  Applying the monitor   Shave hair from upper left chest.  Hold abrader disc by orange tab. Rub abrader in 40 strokes over the upper left chest as  indicated in your monitor instructions.  Clean area with 4 enclosed alcohol pads. Let dry.  Apply patch as indicated in monitor instructions. Patch will be placed under collarbone on left  side of chest with arrow pointing upward.  Rub patch adhesive wings for 2 minutes. Remove white label marked "1". Remove the white  label marked "2". Rub patch  adhesive wings for 2 additional minutes.  While looking in a mirror, press and release button in center of patch. A small green light will  flash 3-4 times. This will be your only indicator that the monitor has been turned on.  Do not shower for the first 24 hours. You may shower after the first 24 hours.  Press the button if you feel a symptom. You will hear a small click. Record Date, Time and  Symptom in the Patient Logbook.  When you are ready to remove the patch, follow instructions on the last 2 pages of Patient  Logbook. Stick patch monitor onto the last page of Patient Logbook.  Place Patient Logbook in the blue and white box. Use locking tab on box and tape box closed  securely. The blue and white box has prepaid postage on it. Please place it in the mailbox as  soon as possible. Your physician should have your test results approximately 7 days after the  monitor has been [facility]  Call [place]at [phone] you have questions regarding  your ZIO XT patch monitor. Call them immediately if you see an orange light blinking on your  monitor.  If your monitor falls off in less than  4 days, contact our Monitor department at [phone].  If your monitor becomes loose or falls off after 4 days call [employer] at [phone] for  suggestions on securing your monitor        Your cardiac CT will be scheduled at one of the below locations:   [facility] [address] [place], [place] [address] [phone]  If scheduled at [facility], please arrive at the [facility] and Children's Entrance (Entrance C2) of [facility] 30 minutes prior to test start time. You can use the FREE valet parking offered at entrance C (encouraged to control the heart rate for the test)  Proceed to the [facility] Radiology Department (first floor) to check-in and test prep.  All radiology patients and guests should use entrance C2  at [facility], accessed from [facility], even though the hospital's physical address listed is [address].    Please follow these instructions carefully (unless otherwise directed):   On the Night Before the Test: Be sure to Drink plenty of water. Do not consume any caffeinated/decaffeinated beverages or chocolate 12 hours prior to your test. Do not take any antihistamines 12 hours prior to your test.   On the Day of the Test: Drink plenty of water until 1 hour prior to the test. Do not eat any food 1 hour prior to test. You may take your regular medications prior to the test.  Take metoprolol (Lopressor) two hours prior to test. FEMALES- please wear underwire-free bra if available, avoid dresses & tight clothing       After the Test: Drink plenty of water. After receiving IV contrast, you may experience a mild flushed feeling. This is normal. On occasion, you may experience a mild rash up to 24 hours after the test. This is not dangerous. If this occurs, you can take Benadryl 25 mg and increase your fluid intake. If you experience trouble breathing, this can be serious. If it is severe call 911 IMMEDIATELY. If it is mild, please call our office. If you take any of these medications: Glipizide/Metformin, Avandament, Glucavance, please do not take 48 hours after completing test unless otherwise instructed.  We will call to schedule your test 2-4 weeks out understanding that some insurance companies will need an authorization prior to the service being performed.   For non-scheduling related questions, please contact the cardiac imaging nurse navigator should you have any questions/concerns: Marchia Bond, Cardiac Imaging Nurse Navigator Gordy Clement, Cardiac Imaging Nurse Navigator [facility] Direct Office Dial: [phone]   For scheduling needs, including cancellations and rescheduling, please call [place],  [phone].    Signed, Mertie Moores, MD  07/15/2022 6:14 PM    [place]

## 2022-07-15 NOTE — Patient Instructions (Addendum)
Medication Instructions:  Your physician recommends that you continue on your current medications as directed. Please refer to the Current Medication list given to you today.  *If you need a refill on your cardiac medications before your next appointment, please call your pharmacy*   Lab Work: BMET  If you have labs (blood work) drawn today and your tests are completely normal, you will receive your results only by: [place] (if you have MyChart) OR A paper copy in the mail If you have any lab test that is abnormal or we need to change your treatment, we will call you to review the results.   Testing/Procedures: Your physician has requested that you have cardiac CT. Cardiac computed tomography (CT) is a painless test that uses an x-ray machine to take clear, detailed pictures of your heart. For further information please visit [email or web address].[email or web address]. Please follow instruction sheet as given.   Your physician has recommended that you wear an event monitor. Event monitors are medical devices that record the heart's electrical activity. Doctors most often [place] these monitors to diagnose arrhythmias. Arrhythmias are problems with the speed or rhythm of the heartbeat. The monitor is a small, portable device. You can wear one while you do your normal daily activities. This is usually used to diagnose what is causing palpitations/syncope (passing out).   Follow-Up: At [facility], you and your health needs are our priority.  As part of our continuing mission to provide you with exceptional heart care, we have created designated Provider Care Teams.  These Care Teams include your primary Cardiologist (physician) and Advanced Practice Providers (APPs -  Physician Assistants and Nurse Practitioners) who all work together to provide you with the care you need, when you need it.  We recommend signing up for the patient portal called "MyChart".  Sign up information is provided on this After  Visit Summary.  MyChart is used to connect with patients for Virtual Visits (Telemedicine).  Patients are able to view lab/test results, encounter notes, upcoming appointments, etc.  Non-urgent messages can be sent to your provider as well.   To learn more about what you can do with MyChart, go to [email or web address].    Your next appointment:   3 month(s)  Provider:   Dr. Acie Fredrickson  Other Instructions [place] Monitor Instructions  Your physician has requested you wear a ZIO patch monitor for 14 days.  This is a single patch monitor. [employer] supplies one patch monitor per enrollment. Additional stickers are not available. Please do not apply patch if you will be having a Nuclear Stress Test,  Echocardiogram, Cardiac CT, MRI, or Chest Xray during the period you would be wearing the  monitor. The patch cannot be worn during these tests. You cannot remove and re-apply the  ZIO XT patch monitor.  Your ZIO patch monitor will be mailed 3 day USPS to your address on file. It may take 3-5 days  to receive your monitor after you have been enrolled.  Once you have received your monitor, please review the enclosed instructions. Your monitor  has already been registered assigning a specific monitor serial # to you.  Billing and Patient Assistance Program Information  We have supplied [employer] with any of your insurance information on file for billing purposes. [employer] offers a sliding scale Patient Assistance Program for patients that do not have  insurance, or whose insurance does not completely cover the cost of the ZIO monitor.  You must apply for the Patient Assistance  Program to qualify for this discounted rate.  To apply, please call [employer] at [phone], select option 4, select option 2, ask to apply for  Patient Assistance Program. Theodore Demark will ask your household income, and how many people  are in your household. They will quote your out-of-pocket cost based on that  information.  [employer] will also be able to set up a 64-month interest-free payment plan if needed.  Applying the monitor   Shave hair from upper left chest.  Hold abrader disc by orange tab. Rub abrader in 40 strokes over the upper left chest as  indicated in your monitor instructions.  Clean area with 4 enclosed alcohol pads. Let dry.  Apply patch as indicated in monitor instructions. Patch will be placed under collarbone on left  side of chest with arrow pointing upward.  Rub patch adhesive wings for 2 minutes. Remove white label marked "1". Remove the white  label marked "2". Rub patch adhesive wings for 2 additional minutes.  While looking in a mirror, press and release button in center of patch. A small green light will  flash 3-4 times. This will be your only indicator that the monitor has been turned on.  Do not shower for the first 24 hours. You may shower after the first 24 hours.  Press the button if you feel a symptom. You will hear a small click. Record Date, Time and  Symptom in the Patient Logbook.  When you are ready to remove the patch, follow instructions on the last 2 pages of Patient  Logbook. Stick patch monitor onto the last page of Patient Logbook.  Place Patient Logbook in the blue and white box. Use locking tab on box and tape box closed  securely. The blue and white box has prepaid postage on it. Please place it in the mailbox as  soon as possible. Your physician should have your test results approximately 7 days after the  monitor has been [facility]  Call [place]at [phone] you have questions regarding  your ZIO XT patch monitor. Call them immediately if you see an orange light blinking on your  monitor.  If your monitor falls off in less than 4 days, contact our Monitor department at [phone]  If your monitor becomes loose or falls off after 4 days call [employer] at [phone]  suggestions on  securing your monitor        Your cardiac CT will be scheduled at one of the below locations:   [facility] [phone] [facility] please [facility] minutes [facility] to test start time. You can use the FREE valet parking offered at entrance C (encouraged to control the heart rate for the test)  [facility]) to check-in and test prep.  All radiology patients and guests should use entrance C2 [facility] even though the hospital's physical address listed is [address]    Please follow these instructions carefully (unless otherwise directed):   On the Night Before the Test: Be sure to Drink plenty of water. Do not consume any caffeinated/decaffeinated beverages or chocolate 12 hours prior to your test. Do not take any antihistamines 12 hours prior to your test.   On the Day of the Test: Drink  plenty of water until 1 hour prior to the test. Do not eat any food 1 hour prior to test. You may take your regular medications prior to the test.  Take metoprolol (Lopressor) two hours prior to test. FEMALES- please wear underwire-free bra if available, avoid dresses & tight clothing       After the Test: Drink plenty of water. After receiving IV contrast, you may experience a mild flushed feeling. This is normal. On occasion, you may experience a mild rash up to 24 hours after the test. This is not dangerous. If this occurs, you can take Benadryl 25 mg and increase your fluid intake. If you experience trouble breathing, this can be serious. If it is severe call 911 IMMEDIATELY. If it is mild, please call our office. If you take any of these medications: Glipizide/Metformin, Avandament, Glucavance, please do not take 48  hours after completing test unless otherwise instructed.  We will call to schedule your test 2-4 weeks out understanding that some insurance companies will need an authorization prior to the service being performed.   For non-scheduling related questions, please contact the cardiac imaging nurse navigator should you have any questions/concerns: Marchia Bond, Cardiac Imaging Nurse Navigator Gordy Clement, Cardiac Imaging Nurse Navigator  Heart [facility] Vascular Services Direct Office Dial: [phone]   For scheduling needs, including cancellations and rescheduling, please call [place], [phone].

## 2022-07-16 LAB — BASIC METABOLIC PANEL
BUN/Creatinine Ratio: 13 (ref 9–23)
BUN: 12 mg/dL (ref 6–24)
CO2: 25 mmol/L (ref 20–29)
Calcium: 9.5 mg/dL (ref 8.7–10.2)
Chloride: 107 mmol/L — ABNORMAL HIGH (ref 96–106)
Creatinine, Ser: 0.92 mg/dL (ref 0.57–1.00)
Glucose: 76 mg/dL (ref 70–99)
Potassium: 4.1 mmol/L (ref 3.5–5.2)
Sodium: 142 mmol/L (ref 134–144)
eGFR: 72 mL/min/{1.73_m2} (ref >59)

## 2022-07-23 DIAGNOSIS — R072 Precordial pain: Secondary | ICD-10-CM

## 2022-07-23 DIAGNOSIS — R002 Palpitations: Secondary | ICD-10-CM

## 2022-07-29 ENCOUNTER — Telehealth (HOSPITAL_COMMUNITY): Payer: Self-pay | Admitting: Emergency Medicine

## 2022-07-29 NOTE — Telephone Encounter (Signed)
CCTA postponed due to zio patch New appt 08/07/22  Marchia Bond RN Navigator Cardiac Imaging [facility] [phone] Office  [phone] Cell

## 2022-07-30 ENCOUNTER — Ambulatory Visit (HOSPITAL_COMMUNITY): Payer: BC Managed Care – PPO

## 2022-08-06 ENCOUNTER — Telehealth (HOSPITAL_COMMUNITY): Payer: Self-pay | Admitting: Emergency Medicine

## 2022-08-06 NOTE — Telephone Encounter (Signed)
Attempted to call patient regarding upcoming cardiac CT appointment. °Left message on voicemail with name and callback number °Adilenne Ashworth RN Navigator Cardiac Imaging °[facility] °[phone] Office °[phone] Cell ° °

## 2022-08-07 ENCOUNTER — Ambulatory Visit (HOSPITAL_COMMUNITY)
Admission: RE | Admit: 2022-08-07 | Discharge: 2022-08-07 | Disposition: A | Discharge status: Home / Self Care | Payer: BC Managed Care – PPO | Source: Ambulatory Visit | Attending: Cardiovascular Disease | Admitting: Cardiovascular Disease

## 2022-08-07 ENCOUNTER — Telehealth (HOSPITAL_COMMUNITY): Payer: Self-pay | Admitting: *Deleted

## 2022-08-07 DIAGNOSIS — R072 Precordial pain: Secondary | ICD-10-CM | POA: Insufficient documentation

## 2022-08-07 MED ORDER — NITROGLYCERIN 0.4 MG SL SUBL: 0.8000 mg | SUBLINGUAL_TABLET | Freq: Once | SUBLINGUAL | Status: AC

## 2022-08-07 MED ORDER — ONDANSETRON HCL 4 MG/2ML IJ SOLN
INTRAMUSCULAR | Status: AC
  Filled 2022-08-07: qty 2

## 2022-08-07 MED ORDER — ONDANSETRON HCL 4 MG/2ML IJ SOLN
4.0000 mg | Freq: Once | INTRAMUSCULAR | Status: AC
  Administered 2022-08-07: 4 mg via INTRAVENOUS

## 2022-08-07 MED ORDER — NITROGLYCERIN 0.4 MG SL SUBL
SUBLINGUAL_TABLET | SUBLINGUAL | Status: AC
  Administered 2022-08-07: 0.8 mg via SUBLINGUAL
  Filled 2022-08-07: qty 2

## 2022-08-07 MED ORDER — IOHEXOL 350 MG/ML SOLN
100.0000 mL | Freq: Once | INTRAVENOUS | Status: AC | PRN
  Administered 2022-08-07: 100 mL via INTRAVENOUS

## 2022-08-07 NOTE — Telephone Encounter (Signed)
Patient returning call about her upcoming cardiac imaging study; pt verbalizes understanding of appt date/time, parking situation and where to check in, pre-test NPO status and medications ordered, and verified current allergies; name and call back number provided for further questions should they arise  Gordy Clement RN Navigator Cardiac Imaging [facility] [phone] office [phone] cell  Patient to take '100mg'$  metoprolol tartrate two hours prior to her cardiac CT scan. She is aware to arrive at 11am.

## 2022-08-10 IMAGING — MG MM DIGITAL SCREENING BILAT W/ TOMO AND CAD
8 series · 8 of 24 positions shown · non-contrast
Comparison: Previous exam(s).

CLINICAL DATA: Screening.

EXAM:
DIGITAL SCREENING BILATERAL MAMMOGRAM WITH TOMOSYNTHESIS AND CAD
TECHNIQUE: Bilateral screening digital craniocaudal and mediolateral oblique
mammograms were obtained. Bilateral screening digital breast
tomosynthesis was performed. The images were evaluated with
computer-aided detection.

[L MLO synth-2D]
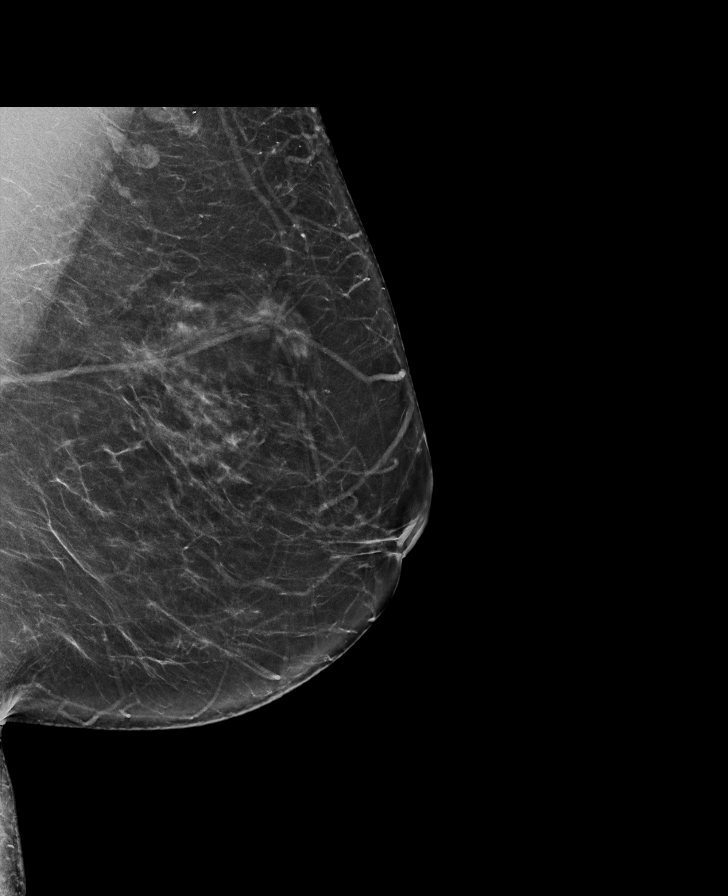

[L CC synth-2D]
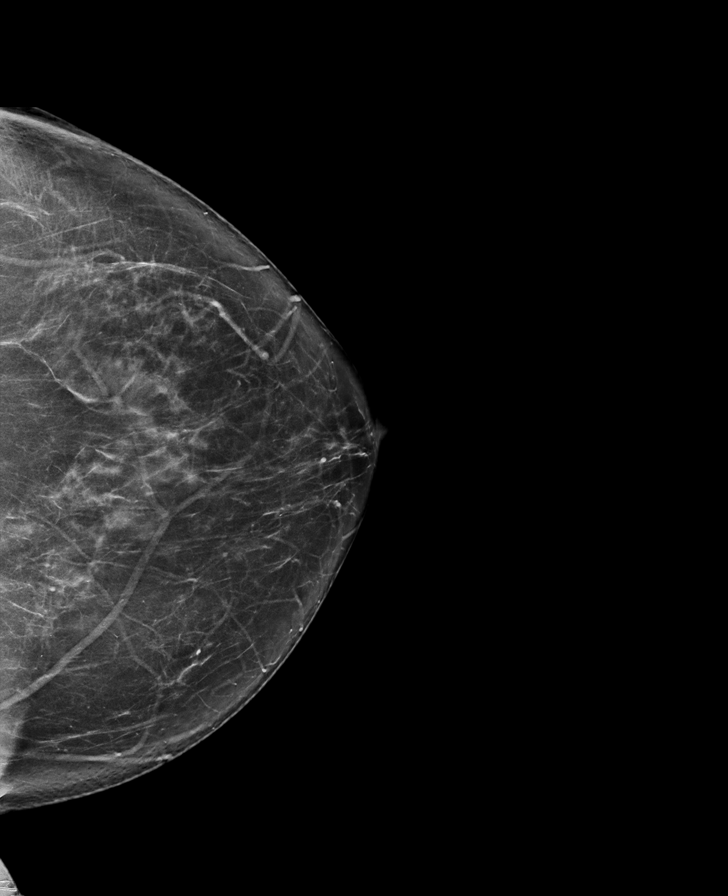

[R MLO synth-2D]
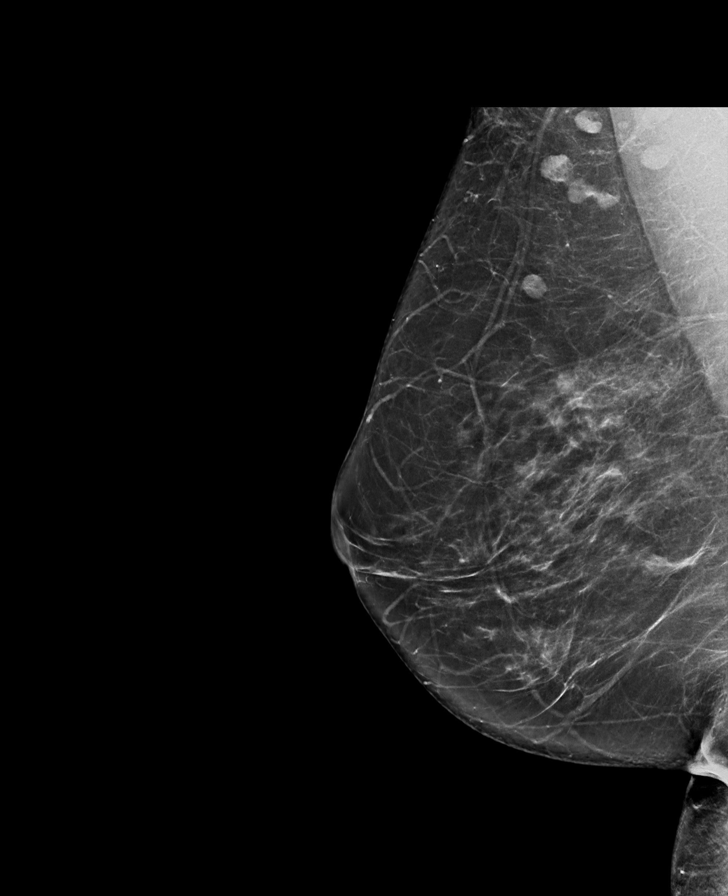

[R CC synth-2D]
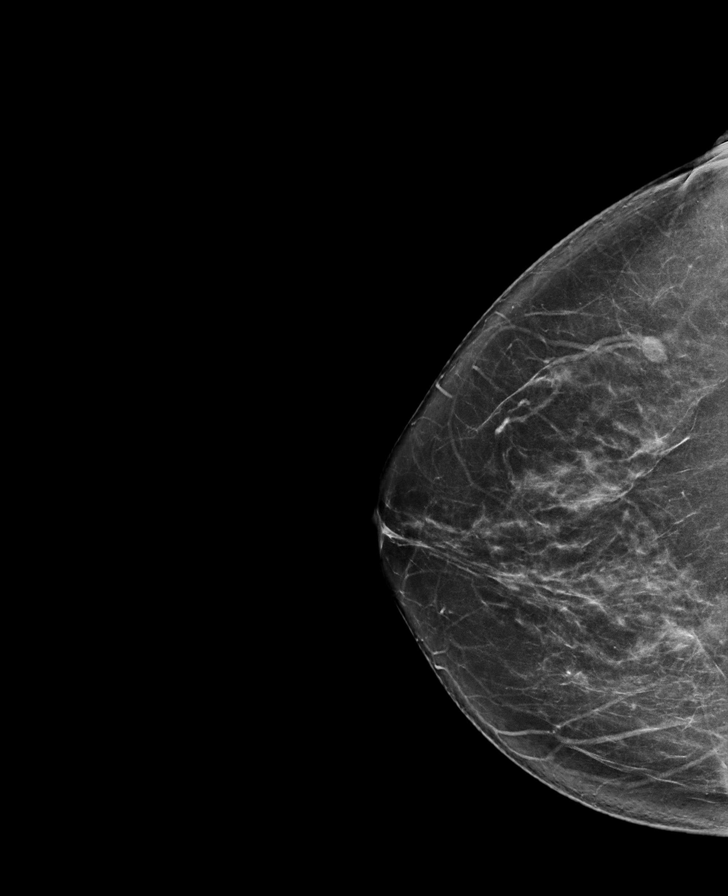

[R CC tomo · tomo slice 40/79.0]
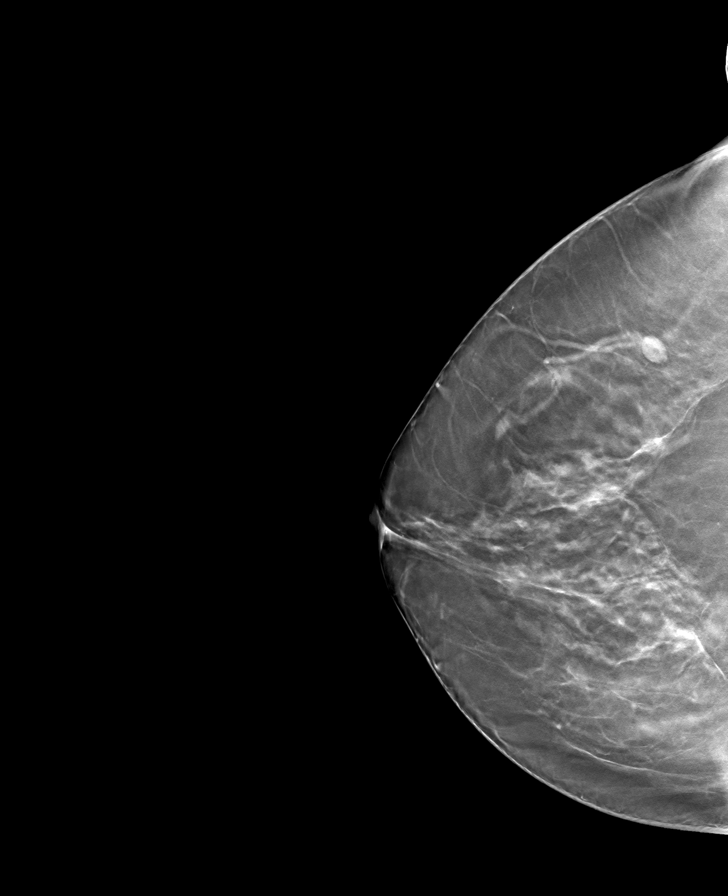

[L CC tomo · tomo slice 41/82.0]
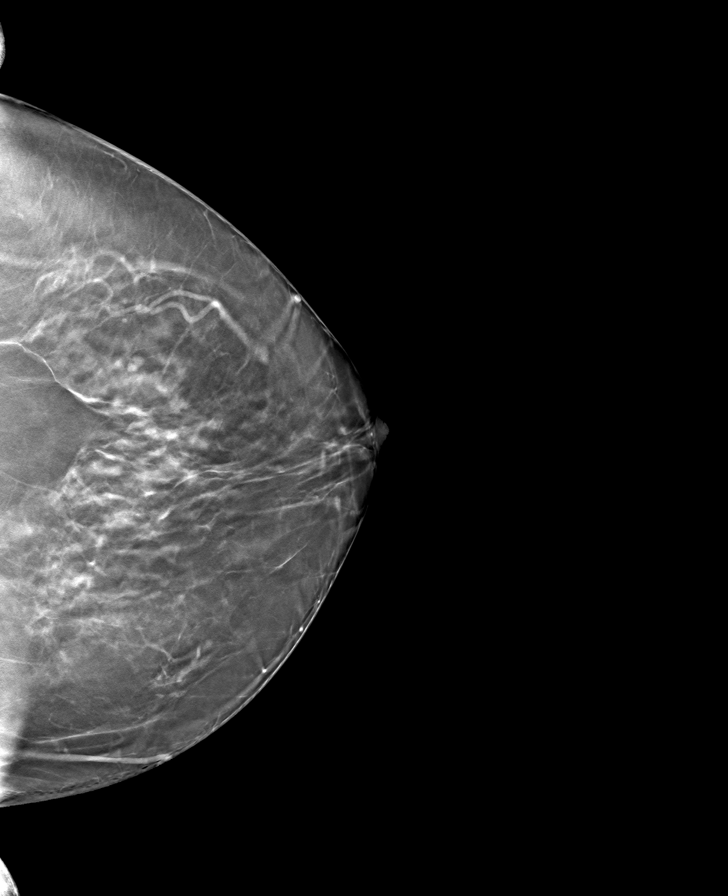

[L MLO tomo · tomo slice 41/82.0]
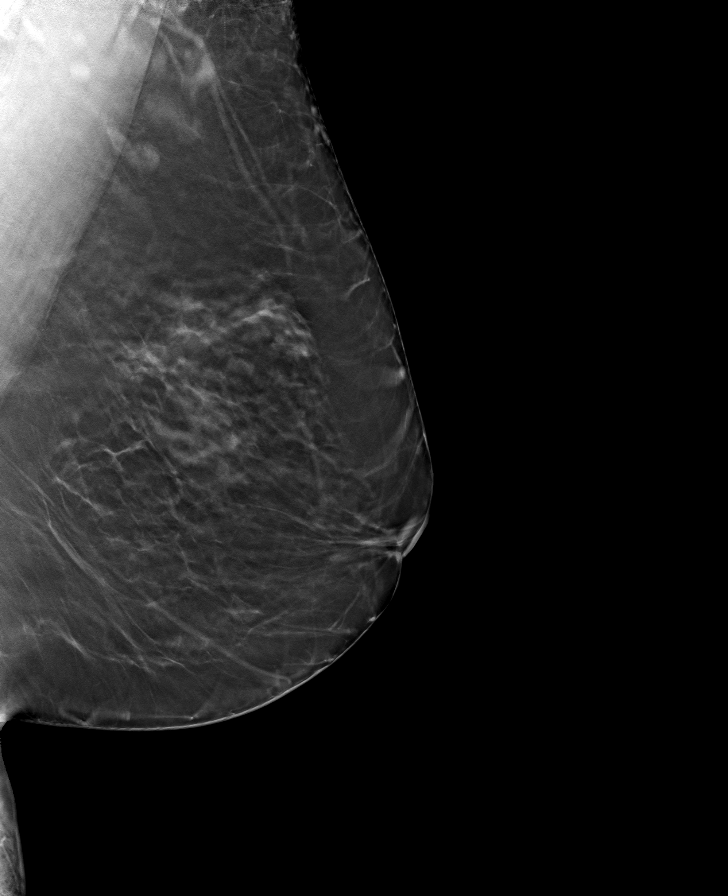

[R MLO tomo · tomo slice 43/84.0]
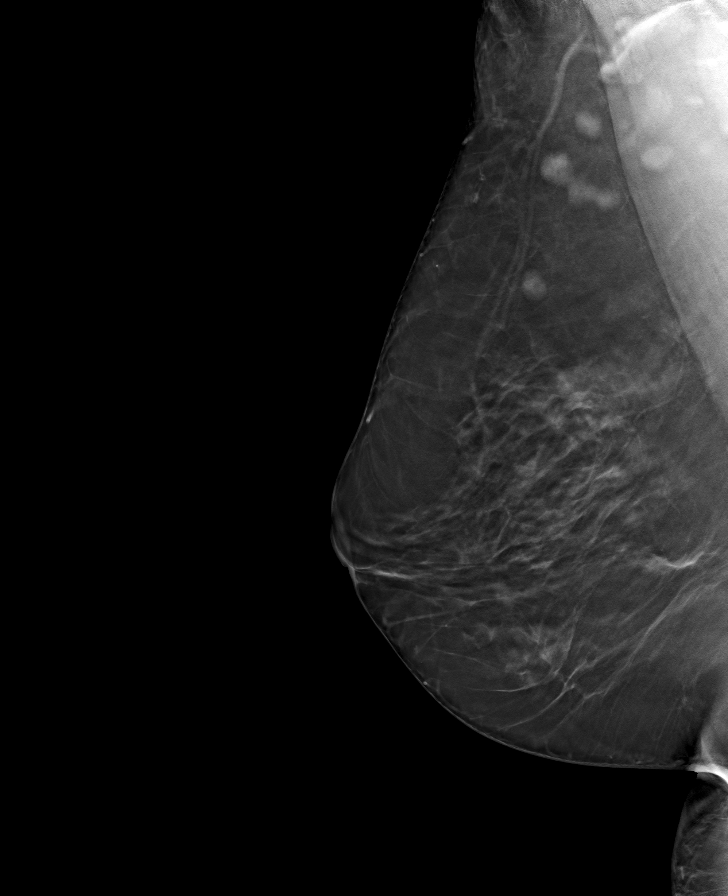

[8 of 24 positions shown; findings below may reference images not displayed]

ACR Breast Density Category b: There are scattered areas of
fibroglandular density.
FINDINGS: There are no findings suspicious for malignancy.
IMPRESSION: No mammographic evidence of malignancy. A result letter of this
screening mammogram will be mailed directly to the patient.

RECOMMENDATION:
Screening mammogram in one year. (Code:51-O-LD2)

BI-RADS CATEGORY  1: Negative.

## 2022-10-16 ENCOUNTER — Encounter: Payer: Self-pay | Admitting: Emergency Medicine

## 2022-10-16 ENCOUNTER — Ambulatory Visit
Admission: EM | Admit: 2022-10-16 | Discharge: 2022-10-16 | Disposition: A | Discharge status: Home / Self Care | Payer: BC Managed Care – PPO | Attending: Internal Medicine | Admitting: Internal Medicine

## 2022-10-16 DIAGNOSIS — J069 Acute upper respiratory infection, unspecified: Secondary | ICD-10-CM | POA: Diagnosis not present

## 2022-10-16 MED ORDER — BENZONATATE 100 MG PO CAPS: 100.0000 mg | ORAL_CAPSULE | Freq: Three times a day (TID) | ORAL | 0 refills | Status: DC

## 2022-10-16 MED ORDER — PROMETHAZINE-DM 6.25-15 MG/5ML PO SYRP: 5.0000 mL | ORAL_SOLUTION | Freq: Every evening | ORAL | 0 refills | Status: DC | PRN

## 2022-10-16 NOTE — ED Triage Notes (Signed)
Pt said she has been having cough, congestion, sore throat off and on, and she said her congestion is more in her chest area. No fevers, some fatigue. Pt is taking Theraflu and ibuprofen.

## 2022-10-16 NOTE — Discharge Instructions (Signed)
You have a viral upper respiratory infection.   Use the following medicines to help with symptoms: - Plain Mucinex (guaifenesin) over the counter as directed every 12 hours to thin mucous so that you are able to get it out of your body easier. Drink plenty of water while taking this medication so that it works well in your body (at least 8 cups a day).  - Tylenol 1,000mg and/or ibuprofen 600mg every 6 hours with food as needed for aches/pains or fever/chills.  - Tessalon perles every 8 hours as needed for cough. - Take Promethazine DM cough medication to help with your cough at nighttime so that you are able to sleep. Do not drive, drink alcohol, or go to work while taking this medication since it can make you sleepy. Only take this at nighttime.   1 tablespoon of honey in warm water and/or salt water gargles may also help with symptoms. Humidifier to your room will help add water to the air and reduce coughing.  If you develop any new or worsening symptoms, please return.  If your symptoms are severe, please go to the emergency room.  Follow-up with your primary care provider for further evaluation and management of your symptoms as well as ongoing wellness visits.  I hope you feel better!  

## 2022-10-16 NOTE — ED Provider Notes (Signed)
[facility]    CSN: [id number] Arrival date & time: 10/16/22  1521      History   Chief Complaint Chief Complaint  Patient presents with   Cough   Nasal Congestion   Sore Throat    HPI Danielle Arellano is a 57 y.o. female.   Patient presents to urgent care for evaluation of cough, nasal congestion, sore throat, and generalized fatigue that started 4 days ago on Oct 13, 2022.  She states cough is sometimes productive and sometimes dry.  Cough makes it difficult to sleep at night.  No orthopnea, leg swelling, nausea, vomiting, dizziness, body aches, fever/chills, recent antibiotic/steroid use, or history of chronic respiratory problems.  Non-smoker, denies drug use.  She was with family last weekend and believes that she may have been exposed to a viral illness there.  Otherwise, no other known sick contacts.  Taking over-the-counter TheraFlu without much relief of symptoms.   Cough Sore Throat    Past Medical History:  Diagnosis Date   Bladder mass 2016   right ureterocele on cystoscopy and CT   Carpal tunnel syndrome    Complication of anesthesia    itched post op-not sure what-used nubain   Elevated hemoglobin A1c 02/14/15   HgbA1C 5.9   Fibroid 05/08/06   TAH retained OV   Foot mass, right ganglion 01/22/2012   Leukopenia 4/95   benign- annual CBC   Lichen planus 2021   No pertinent past medical history    PONV (postoperative nausea and vomiting)    has motion sickness-used a scop patch   Sleep apnea    Thyromegaly    Normal thyroid US in 2014.   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   History of lichen planus 10/23/2016   Hyperpigmentation 03/12/2016   Lichen planus 10/26/2015   Neoplasm of uncertain behavior of skin 10/26/2015   Laboratory exam ordered as part of routine general medical examination 02/14/2015   Elevated hemoglobin A1c 02/14/2015   Lymphangioma, 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   Foot mass, right ganglion 01/22/2012    Past Surgical History:  Procedure Laterality Date   ABDOMINAL HYSTERECTOMY N/A    [employer] 06/11/2020   CESAREAN SECTION  04,01   CESAREAN SECTION N/A    [employer]reesia 06/11/2020   DIAGNOSTIC LAPAROSCOPY     mass rt ovary   MASS EXCISION  01/22/2012   Procedure: EXCISION MASS;  Surgeon: Eulas Post, MD;  Location: [facility];  Service: Orthopedics;  Laterality: Right;  Excision mass right dorsal foot   Myomectomies  10/99   Multiple   TOTAL ABDOMINAL HYSTERECTOMY  2007   TAH-adhesions    OB History     Gravida  2   Para  2   Term      Preterm      AB      Living  2      SAB      IAB      Ectopic      Multiple      Live Births  Home Medications    Prior to Admission medications   Medication Sig Start Date End Date Taking? Authorizing Provider  benzonatate (TESSALON) 100 MG capsule Take 1 capsule (100 mg total) by mouth every 8 (eight) hours. 10/16/22  Yes Carlisle Beers, FNP  promethazine-dextromethorphan (PROMETHAZINE-DM) 6.25-15 MG/5ML syrup Take 5 mLs by mouth at bedtime as needed for cough. 10/16/22  Yes Carlisle Beers, FNP  Biotin 5 MG CAPS Take 1 capsule by mouth daily.    [provider]  clobetasol ointment (TEMOVATE) 0.05 % Apply 1 application. topically 2 (two) times daily. 08/26/21   Janalyn Harder, MD  Clobetasol Prop Emollient Base (CLOBETASOL PROPIONATE E) 0.05 % emollient cream Apply 1 application topically 2 (two) times daily. 07/02/21   Janalyn Harder, MD  LUTEIN PO Take 1 tablet by mouth daily.    [provider]   metoprolol tartrate (LOPRESSOR) 100 MG tablet Take 1 tablet (100 mg total) by mouth once for 1 dose. Take 1 tablet (100 mg total) two hours prior to CT scan. 07/15/22 07/15/22  Nahser, Deloris Ping, MD  Multiple Vitamin (MULTI-VITAMINS PO) Take by mouth daily.    [provider]  nystatin (MYCOSTATIN/NYSTOP) powder Apply 1 application. topically 3 (three) times daily. Apply to affected area for up to 7 days 10/09/21   Patton Salles, MD  Omega-3 Fatty Acids (FISH OIL) 1000 MG CAPS Take 1 capsule by mouth daily.    [provider]  tacrolimus (PROTOPIC) 0.1 % ointment APPLY TO THE AFFECTED AREAS DAILY 08/26/21   Janalyn Harder, MD  triamcinolone ointment (KENALOG) 0.5 % Apply 1 application. topically 2 (two) times daily. Use for 2 weeks at a time as needed for vulvar irritation.  Use twice weekly for maintenance dosing. 10/09/21   Patton Salles, MD  XIIDRA 5 % SOLN  09/29/16   [provider]    Family History Family History  Problem Relation Age of Onset   Epilepsy Father    Stroke Father    Arthritis Mother    Multiple myeloma Brother    Cancer Brother        multiple myeloma    Social History Social History   Tobacco Use   Smoking status: Never   Smokeless tobacco: Never  Vaping Use   Vaping Use: Never used  Substance Use Topics   Alcohol use: Yes    Comment: 2 drinks/month   Drug use: No     Allergies   Patient has no known allergies.   Review of Systems Review of Systems  Respiratory:  Positive for cough.   Per HPI   Physical Exam Triage Vital Signs ED Triage Vitals [10/16/22 1657]  Enc Vitals Group     BP 124/82     Pulse Rate 84     Resp 16     Temp 98.7 F (37.1 C)     Temp Source Oral     SpO2 98 %     Weight      Height      Head Circumference      Peak Flow      Pain Score 2     Pain Loc      Pain Edu?      Excl. in GC?    No data found.  Updated Vital Signs BP 124/82 (BP Location: Left Arm)   Pulse  84   Temp 98.7 F (37.1 C) (Oral)   Resp 16   LMP 06/08/2005 (  Approximate)   SpO2 98%   Visual Acuity Right Eye Distance:   Left Eye Distance:   Bilateral Distance:    Right Eye Near:   Left Eye Near:    Bilateral Near:     Physical Exam Vitals and nursing note reviewed.  Constitutional:      Appearance: She is not ill-appearing or toxic-appearing.  HENT:     Head: Normocephalic and atraumatic.     Right Ear: Hearing, tympanic membrane, ear canal and external ear normal.     Left Ear: Hearing, tympanic membrane, ear canal and external ear normal.     Nose: Nose normal.     Mouth/Throat:     Lips: Pink.     Mouth: Mucous membranes are moist. No injury.     Tongue: No lesions. Tongue does not deviate from midline.     Palate: No mass and lesions.     Pharynx: Oropharynx is clear. Uvula midline. No pharyngeal swelling, oropharyngeal exudate, posterior oropharyngeal erythema or uvula swelling.     Tonsils: No tonsillar exudate or tonsillar abscesses.  Eyes:     General: Lids are normal. Vision grossly intact. Gaze aligned appropriately.     Extraocular Movements: Extraocular movements intact.     Conjunctiva/sclera: Conjunctivae normal.  Cardiovascular:     Rate and Rhythm: Normal rate and regular rhythm.     Heart sounds: Normal heart sounds, S1 normal and S2 normal.  Pulmonary:     Effort: Pulmonary effort is normal. No respiratory distress.     Breath sounds: Normal breath sounds and air entry.  Musculoskeletal:     Cervical back: Neck supple.  Skin:    General: Skin is warm and dry.     Capillary Refill: Capillary refill takes less than 2 seconds.     Findings: No rash.  Neurological:     General: No focal deficit present.     Mental Status: She is alert and oriented to person, place, and time. Mental status is at baseline.     Cranial Nerves: No dysarthria or facial asymmetry.  Psychiatric:        Mood and Affect: Mood normal.        Speech: Speech normal.         Behavior: Behavior normal.        Thought Content: Thought content normal.        Judgment: Judgment normal.      UC Treatments / Results  Labs (all labs ordered are listed, but only abnormal results are displayed) Labs Reviewed - No data to display  EKG   Radiology No results found.  Procedures Procedures (including critical care time)  Medications Ordered in UC Medications - No data to display  Initial Impression / Assessment and Plan / UC Course  I have reviewed the triage vital signs and the nursing notes.  Pertinent labs & imaging results that were available during my care of the patient were reviewed by me and considered in my medical decision making (see chart for details).   1. Viral URI with cough Symptoms and physical exam consistent with a viral upper respiratory tract infection that will likely resolve with rest, fluids, and prescriptions for symptomatic relief. Deferred imaging based on stable cardiopulmonary exam and hemodynamically stable vital signs. Deferred viral testing using shared decision making as patient is low risk for severe disease related to potential COVID-19 illness and there is low suspicion for influenza. Discussed updated CDC guidelines for masking related to COVID-19.  Tessalon Perles and Promethazine DM sent to pharmacy for symptomatic relief to be taken as prescribed.  May continue taking over the counter medications as directed for further symptomatic relief.  Drowsiness precautions discussed regarding promethazine DM prescription.  Nonpharmacologic interventions for symptom relief provided and after visit summary below. Advised to push fluids to stay well hydrated while recovering from viral illness.   Discussed physical exam and available lab work findings in clinic with patient.  Counseled patient regarding appropriate use of medications and potential side effects for all medications recommended or prescribed today. Discussed red flag signs  and symptoms of worsening condition,when to call the PCP office, return to urgent care, and when to seek higher level of care in the emergency department. Patient verbalizes understanding and agreement with plan. All questions answered. Patient discharged in stable condition.   Final Clinical Impressions(s) / UC Diagnoses   Final diagnoses:  Viral URI with cough     Discharge Instructions      You have a viral upper respiratory infection.   Use the following medicines to help with symptoms: - Plain Mucinex (guaifenesin) over the counter as directed every 12 hours to thin mucous so that you are able to get it out of your body easier. Drink plenty of water while taking this medication so that it works well in your body (at least 8 cups a day).  - Tylenol 1,000mg  and/or ibuprofen 600mg  every 6 hours with food as needed for aches/pains or fever/chills.  - Tessalon perles every 8 hours as needed for cough. - Take Promethazine DM cough medication to help with your cough at nighttime so that you are able to sleep. Do not drive, drink alcohol, or go to work while taking this medication since it can make you sleepy. Only take this at nighttime.   1 tablespoon of honey in warm water and/or salt water gargles may also help with symptoms. Humidifier to your room will help add water to the air and reduce coughing.  If you develop any new or worsening symptoms, please return.  If your symptoms are severe, please go to the emergency room.  Follow-up with your primary care provider for further evaluation and management of your symptoms as well as ongoing wellness visits.  I hope you feel better!   ED Prescriptions     Medication Sig Dispense Auth. Provider   benzonatate (TESSALON) 100 MG capsule Take 1 capsule (100 mg total) by mouth every 8 (eight) hours. 21 capsule Carlisle Beers, FNP   promethazine-dextromethorphan (PROMETHAZINE-DM) 6.25-15 MG/5ML syrup Take 5 mLs by mouth at bedtime as  needed for cough. 118 mL Carlisle Beers, FNP      PDMP not reviewed this encounter.   Carlisle Beers, [place] 10/16/22 1731

## 2022-10-21 ENCOUNTER — Ambulatory Visit: Payer: BC Managed Care – PPO | Admitting: Cardiovascular Disease

## 2022-10-26 NOTE — Progress Notes (Signed)
57 y.o. G2P2 Married [place] American female here for annual exam.    Vulvar irritation is better.  Using clobetasol occasionally and has good results from it.  Needs refill of clobetasol and nystatin powder.   Her dermatologist recommended taking iron due to iron levels being low.  Donates blood regularly.   Will go on cruise to [place].   PCP:   Mayer Masker, PA  Patient's last menstrual period was 06/08/2005 (approximate).           Sexually active: Yes.    The current method of family planning is status post hysterectomy.    Exercising: Yes.     walking Smoker:  no  Health Maintenance: Pap:  2009 neg History of abnormal Pap:  no MMG:  05/04/22 Breast Density Cat C, BI-RADS CAT 1 neg Colonoscopy:  01/14/15 - normal.  Due in 2026. BMD:   n/a  Result  n/a TDaP:  12/07/18 Gardasil:   no HIV:09/27/15 NR Hep C: 09/27/15 neg Screening Labs:   PCP   reports that she has never smoked. She has never used smokeless tobacco. She reports current alcohol use. She reports that she does not use drugs.  Past Medical History:  Diagnosis Date   Bladder mass 2016   right ureterocele on cystoscopy and CT   Carpal tunnel syndrome    Complication of anesthesia    itched post op-not sure what-used nubain   Elevated hemoglobin A1c 02/14/15   HgbA1C 5.9   Fibroid 05/08/06   TAH retained OV   Foot mass, right ganglion 01/22/2012   Leukopenia 4/95   benign- annual CBC   Lichen planus 2021   No pertinent past medical history    PONV (postoperative nausea and vomiting)    has motion sickness-used a scop patch   Sleep apnea    Thyromegaly    Normal thyroid US in 2014.   Ureterocele    right    Past Surgical History:  Procedure Laterality Date   ABDOMINAL HYSTERECTOMY N/A    [employer] 06/11/2020   CESAREAN SECTION  04,01   CESAREAN SECTION N/A    [employer] 06/11/2020   DIAGNOSTIC LAPAROSCOPY     mass rt ovary   MASS EXCISION  01/22/2012   Procedure: EXCISION MASS;  Surgeon: Eulas Post, MD;  Location: [facility];  Service: Orthopedics;  Laterality: Right;  Excision mass right dorsal foot   Myomectomies  10/99   Multiple   TOTAL ABDOMINAL HYSTERECTOMY  2007   TAH-adhesions    Current Outpatient Medications  Medication Sig Dispense Refill   Biotin 5 MG CAPS Take 1 capsule by mouth daily.     Clobetasol Prop Emollient Base (CLOBETASOL PROPIONATE E) 0.05 % emollient cream Apply 1 application topically 2 (two) times daily. 60 g 1   Ferrous Sulfate (IRON PO) Take by mouth.     LUTEIN PO Take 1 tablet by mouth daily.     Multiple Vitamin (MULTI-VITAMINS PO) Take by mouth daily.     Omega-3 Fatty Acids (FISH OIL) 1000 MG CAPS Take 1 capsule by mouth daily.     XIIDRA 5 % SOLN      clobetasol ointment (TEMOVATE) 0.05 % Apply 1 Application topically 2 (two) times daily. Use twice a day for 2 weeks for a flare as needed.  Use twice a week at hs twice a week for maintenance dosing. 60 g 1   nystatin (MYCOSTATIN/NYSTOP) powder Apply 1 Application topically 3 (three) times daily. Apply to affected  area for up to 7 days 30 g 3   tacrolimus (PROTOPIC) 0.1 % ointment APPLY TO THE AFFECTED AREAS DAILY (Patient not taking: Reported on 11/09/2022) 100 g 1   No current facility-administered medications for this visit.    Family History  Problem Relation Age of Onset   Epilepsy Father    Stroke Father    Arthritis Mother    Multiple myeloma Brother    Cancer Brother        multiple myeloma    Review of Systems  All other systems reviewed and are negative.   Exam:   BP 118/78 (BP Location: Right Arm, Patient Position: Sitting)   Pulse 84   Resp 18   Ht 5' 3.39" (1.61 m)   Wt 192 lb (87.1 kg)   LMP 06/08/2005 (Approximate)   SpO2 99%   BMI 33.60 kg/m     General appearance: alert, cooperative and appears stated age Head: normocephalic, without obvious abnormality, atraumatic Neck: no adenopathy, supple, symmetrical, trachea midline and thyroid normal to  inspection and palpation Lungs: clear to auscultation bilaterally Breasts: normal appearance, no masses or tenderness, No nipple retraction or dimpling, No nipple discharge or bleeding, No axillary adenopathy Heart: regular rate and rhythm Abdomen: soft, non-tender; no masses, no organomegaly Extremities: extremities normal, atraumatic, no cyanosis or edema Skin: skin color, texture, turgor normal. No rashes or lesions Lymph nodes: cervical, supraclavicular, and axillary nodes normal. Neurologic: grossly normal  Pelvic: External genitalia:  no lesions              No abnormal inguinal nodes palpated.              Urethra:  normal appearing urethra with no masses, tenderness or lesions              Bartholins and Skenes: normal                 Vagina: normal appearing vagina with normal color and discharge, no lesions              Cervix: absent              Pap taken: no Bimanual Exam:  Uterus:  absent              Adnexa: no mass, fullness, tenderness              Rectal exam: yes.  Confirms.              Anus:  normal sphincter tone, no lesions  Chaperone was present for exam:  Chapman Fitch, RN  Assessment:   Well woman visit with gynecologic exam. Status post TAH.  Status post LSO.  Right ovary remains.  Ureterocele. Hx thyromegaly.  Lichen planus.  Right arm.  Treated with clobetasol.  Chronic vulvitis.  Bx showed perivascular dermatitis and folliculitis.  Hx Candida of flexural folds of skin.  Plan: Mammogram screening discussed. Self breast awareness reviewed. Pap and HR HPV as above. Guidelines for Calcium, Vitamin D, regular exercise program including cardiovascular and weight bearing exercise. Rx for Clobetasol ointment and Nystatin powder.   Follow up annually and prn.

## 2022-11-09 ENCOUNTER — Ambulatory Visit (INDEPENDENT_AMBULATORY_CARE_PROVIDER_SITE_OTHER): Payer: BC Managed Care – PPO | Admitting: Obstetrics and Gynecology

## 2022-11-09 ENCOUNTER — Encounter: Payer: Self-pay | Admitting: Obstetrics and Gynecology

## 2022-11-09 ENCOUNTER — Other Ambulatory Visit: Payer: Self-pay | Admitting: Obstetrics and Gynecology

## 2022-11-09 VITALS — BP 118/78 | HR 84 | Resp 18 | Ht 63.39 in | Wt 192.0 lb

## 2022-11-09 DIAGNOSIS — N763 Subacute and chronic vulvitis: Secondary | ICD-10-CM

## 2022-11-09 DIAGNOSIS — Z01419 Encounter for gynecological examination (general) (routine) without abnormal findings: Secondary | ICD-10-CM | POA: Diagnosis not present

## 2022-11-09 MED ORDER — CLOBETASOL PROPIONATE 0.05 % EX OINT: 1.0000 | TOPICAL_OINTMENT | Freq: Two times a day (BID) | CUTANEOUS | 1 refills | Status: AC

## 2022-11-09 MED ORDER — NYSTATIN 100000 UNIT/GM EX POWD: 1.0000 | Freq: Three times a day (TID) | CUTANEOUS | 3 refills | Status: AC

## 2022-11-09 NOTE — Patient Instructions (Signed)
EXERCISE AND DIET:  We recommended that you start or continue a regular exercise program for good health. Regular exercise means any activity that makes your heart beat faster and makes you sweat.  We recommend exercising at least 30 minutes per day at least 3 days a week, preferably 4 or 5.  We also recommend a diet low in fat and sugar.  Inactivity, poor dietary choices and obesity can cause diabetes, heart attack, stroke, and kidney damage, among others.    ALCOHOL AND SMOKING:  Women should limit their alcohol intake to no more than 7 drinks/beers/glasses of wine (combined, not each!) per week. Moderation of alcohol intake to this level decreases your risk of breast cancer and liver damage. And of course, no recreational drugs are part of a healthy lifestyle.  And absolutely no smoking or even second hand smoke. Most people know smoking can cause heart and lung diseases, but did you know it also contributes to weakening of your bones? Aging of your skin?  Yellowing of your teeth and nails?  CALCIUM AND VITAMIN D:  Adequate intake of calcium and Vitamin D are recommended.  The recommendations for exact amounts of these supplements seem to change often, but generally speaking 600 mg of calcium (either carbonate or citrate) and 800 units of Vitamin D per day seems prudent. Certain women may benefit from higher intake of Vitamin D.  If you are among these women, your doctor will have told you during your visit.    PAP SMEARS:  Pap smears, to check for cervical cancer or precancers,  have traditionally been done yearly, although recent scientific advances have shown that most women can have pap smears less often.  However, every woman still should have a physical exam from her gynecologist every year. It will include a breast check, inspection of the vulva and vagina to check for abnormal growths or skin changes, a visual exam of the cervix, and then an exam to evaluate the size and shape of the uterus and  ovaries.  And after 59 years of age, a rectal exam is indicated to check for rectal cancers. We will also provide age appropriate advice regarding health maintenance, like when you should have certain vaccines, screening for sexually transmitted diseases, bone density testing, colonoscopy, mammograms, etc.   MAMMOGRAMS:  All women over 79 years old should have a yearly mammogram. Many facilities now offer a "3D" mammogram, which may cost around $50 extra out of pocket. If possible,  we recommend you accept the option to have the 3D mammogram performed.  It both reduces the number of women who will be called back for extra views which then turn out to be normal, and it is better than the routine mammogram at detecting truly abnormal areas.    COLONOSCOPY:  Colonoscopy to screen for colon cancer is recommended for all women at age 20.  We know, you hate the idea of the prep.  We agree, BUT, having colon cancer and not knowing it is worse!!  Colon cancer so often starts as a polyp that can be seen and removed at colonscopy, which can quite literally save your life!  And if your first colonoscopy is normal and you have no family history of colon cancer, most women don't have to have it again for 10 years.  Once every ten years, you can do something that may end up saving your life, right?  We will be happy to help you get it scheduled when you are ready.  Be sure to check your insurance coverage so you understand how much it will cost.  It may be covered as a preventative service at no cost, but you should check your particular policy.    Calcium Content in Foods Calcium is the most abundant mineral in the body. Most of the body's calcium supply is stored in bones and teeth. Calcium helps many parts of the body function normally, including: Blood and blood vessels. Nerves. Hormones. Muscles. Bones and teeth. When your calcium stores are low, you may be at risk for low bone mass, bone loss, and broken bones  (fractures). When you get enough calcium, it helps to support strong bones and teeth throughout your life. Calcium is especially important for: Children during growth spurts. Girls during adolescence. Women who are pregnant or breastfeeding. Women after their menstrual cycle stops (postmenopause). Women whose menstrual cycle has stopped due to anorexia nervosa or regular intense exercise. People who cannot eat or digest dairy products. Vegans. Recommended daily amounts of calcium: Women (ages 13 to 45): 1,000 mg per day. Women (ages 59 and older): 1,200 mg per day. Men (ages 13 to 44): 1,000 mg per day. Men (ages 61 and older): 1,200 mg per day. Women (ages 17 to 20): 1,300 mg per day. Men (ages 69 to 79): 1,300 mg per day. General information Eat foods that are high in calcium. Try to get most of your calcium from food. Some people may benefit from taking calcium supplements. Check with your health care provider or diet and nutrition specialist (dietitian) before starting any calcium supplements. Calcium supplements may interact with certain medicines. Too much calcium may cause other health problems, such as constipation and kidney stones. For the body to absorb calcium, it needs vitamin D. Sources of vitamin D include: Skin exposure to direct sunlight. Foods, such as egg yolks, liver, mushrooms, saltwater fish, and fortified milk. Vitamin D supplements. Check with your health care provider or dietitian before starting any vitamin D supplements. What foods are high in calcium?  Foods that are high in calcium contain more than 100 milligrams per serving. Fruits Fortified orange juice or other fruit juice, 300 mg per 8 oz serving. Vegetables Collard greens, 360 mg per 8 oz serving. Kale, 100 mg per 8 oz serving. Bok choy, 160 mg per 8 oz serving. Grains Fortified ready-to-eat cereals, 100 to 1,000 mg per 8 oz serving. Fortified frozen waffles, 200 mg in 2 waffles. Oatmeal, 140 mg in  1 cup. Meats and other proteins Sardines, canned with bones, 325 mg per 3 oz serving. Salmon, canned with bones, 180 mg per 3 oz serving. Canned shrimp, 125 mg per 3 oz serving. Baked beans, 160 mg per 4 oz serving. Tofu, firm, made with calcium sulfate, 253 mg per 4 oz serving. Dairy Yogurt, plain, low-fat, 310 mg per 6 oz serving. Nonfat milk, 300 mg per 8 oz serving. American cheese, 195 mg per 1 oz serving. Cheddar cheese, 205 mg per 1 oz serving. Cottage cheese 2%, 105 mg per 4 oz serving. Fortified soy, rice, or almond milk, 300 mg per 8 oz serving. Mozzarella, part skim, 210 mg per 1 oz serving. The items listed above may not be a complete list of foods high in calcium. Actual amounts of calcium may be different depending on processing. Contact a dietitian for more information. What foods are lower in calcium? Foods that are lower in calcium contain 50 mg or less per serving. Fruits Apple, about 6 mg. Banana, about 12 mg.  Vegetables Lettuce, 19 mg per 2 oz serving. Tomato, about 11 mg. Grains Rice, 4 mg per 6 oz serving. Boiled potatoes, 14 mg per 8 oz serving. White bread, 6 mg per slice. Meats and other proteins Egg, 27 mg per 2 oz serving. Red meat, 7 mg per 4 oz serving. Chicken, 17 mg per 4 oz serving. Fish, cod, or trout, 20 mg per 4 oz serving. Dairy Cream cheese, regular, 14 mg per 1 Tbsp serving. Brie cheese, 50 mg per 1 oz serving. Parmesan cheese, 70 mg per 1 Tbsp serving. The items listed above may not be a complete list of foods lower in calcium. Actual amounts of calcium may be different depending on processing. Contact a dietitian for more information. Summary Calcium is an important mineral in the body because it affects many functions. Getting enough calcium helps support strong bones and teeth throughout your life. Try to get most of your calcium from food. Calcium supplements may interact with certain medicines. Check with your health care provider  or dietitian before starting any calcium supplements. This information is not intended to replace advice given to you by your health care provider. Make sure you discuss any questions you have with your health care provider. Document Revised: 09/20/2019 Document Reviewed: 09/20/2019 Elsevier Patient Education  2024 [employer].

## 2022-11-10 NOTE — Telephone Encounter (Signed)
Pharmacist notified and voiced understanding and will get prescription ready for pt.

## 2023-01-04 ENCOUNTER — Encounter: Payer: Self-pay | Admitting: Cardiovascular Disease

## 2023-01-04 NOTE — Progress Notes (Unsigned)
Cardiology Office Note:    Date:  01/05/2023   ID:  Criss Alvine, DOB 02/29/1966, MRN [id number]  PCP:  Mayer Masker, PA-C (Inactive)   [facility] Providers Cardiologist:  Pihu Basil  Click to update primary MD,subspecialty MD or APP then REFRESH:1}    Referring MD: Mayer Masker, PA-C   Chief Complaint  Patient presents with   Chest Pain     History of Present Illness:   Feb. 7, 2024    Jamae Urja Bustos is a 57 y.o. female with a hx of palpitations, OSA   I met her in 2020  We discussed gardening at that visit   Started several months ago  Has OSA,  uses CPAP for the past 3 years  Wakes up with some chest heaviness, not specifically heart attack Moderate exercise, gets some of this chest pressure and DOE walking up a hill   Tolerates the CPAP fairly well ,  is going to try out another oral device in a month  Thinks she has a nerve impingement issue in her back    Fam. Hx  No premature CAD    January 05, 2023  Dedra Skeens is seen for follow up of her palpitations, OSA,, chest pressure  Uses a mouth piece for her OSA   Not as much exercise as she would like   She was originally seen for chest pressure.  Coronary CT angiogram revealed a coronary calcium score of 0.  She had no evidence of coronary artery disease.  She does have a small patent foramen ovale which is benign.       Past Medical History:  Diagnosis Date   Bladder mass 2016   right ureterocele on cystoscopy and CT   Carpal tunnel syndrome    Complication of anesthesia    itched post op-not sure what-used nubain   Elevated hemoglobin A1c 02/14/15   HgbA1C 5.9   Fibroid 05/08/06   TAH retained OV   Foot mass, right ganglion 01/22/2012   Leukopenia 4/95   benign- annual CBC   Lichen planus 2021   No pertinent past medical history    PONV (postoperative nausea and vomiting)    has motion sickness-used a scop patch   Sleep apnea    Thyromegaly    Normal thyroid  US in 2014.   Ureterocele    right    Past Surgical History:  Procedure Laterality Date   ABDOMINAL HYSTERECTOMY N/A    [employer]reesia 06/11/2020   CESAREAN SECTION  04,01   CESAREAN SECTION N/A    [employer]reesia 06/11/2020   DIAGNOSTIC LAPAROSCOPY     mass rt ovary   MASS EXCISION  01/22/2012   Procedure: EXCISION MASS;  Surgeon: Eulas Post, MD;  Location: [facility];  Service: Orthopedics;  Laterality: Right;  Excision mass right dorsal foot   Myomectomies  10/99   Multiple   TOTAL ABDOMINAL HYSTERECTOMY  2007   TAH-adhesions    Current Medications: Current Meds  Medication Sig   Biotin 5 MG CAPS Take 1 capsule by mouth daily.   clobetasol ointment (TEMOVATE) 0.05 % Apply 1 Application topically 2 (two) times daily. Use twice a day for 2 weeks for a flare as needed.  Use twice a week at hs twice a week for maintenance dosing.   Clobetasol Prop Emollient Base (CLOBETASOL PROPIONATE E) 0.05 % emollient cream Apply 1 application topically 2 (two) times daily.   Ferrous Sulfate (IRON PO) Take by mouth.  LUTEIN PO Take 1 tablet by mouth daily.   Multiple Vitamin (MULTI-VITAMINS PO) Take by mouth daily.   nystatin (MYCOSTATIN/NYSTOP) powder Apply 1 Application topically 3 (three) times daily. Apply to affected area for up to 7 days   Omega-3 Fatty Acids (FISH OIL) 1000 MG CAPS Take 1 capsule by mouth daily.   tacrolimus (PROTOPIC) 0.1 % ointment APPLY TO THE AFFECTED AREAS DAILY   XIIDRA 5 % SOLN      Allergies:   Patient has no known allergies.   Social History   Socioeconomic History   Marital status: Married    Spouse name: Not on file   Number of children: Not on file   Years of education: Not on file   Highest education level: Not on file  Occupational History   Not on file  Tobacco Use   Smoking status: Never   Smokeless tobacco: Never  Vaping Use   Vaping status: Never Used  Substance and Sexual Activity   Alcohol use: Yes    Comment: 2 drinks/month    Drug use: No   Sexual activity: Yes    Partners: Male    Birth control/protection: Surgical    Comment: TAH  Other Topics Concern   Not on file  Social History Narrative   Not on file   Social Determinants of Health   Financial Resource Strain: Not on file  Food Insecurity: Not on file  Transportation Needs: Not on file  Physical Activity: Not on file  Stress: Not on file  Social Connections: Unknown (10/20/2021)   Received from [facility]   Social Network    Social Network: Not on file     Family History: The patient's family history includes Arthritis in her mother; Cancer in her brother; Epilepsy in her father; Multiple myeloma in her brother; Stroke in her father.  ROS:   Please see the history of present illness.     All other systems reviewed and are negative.  EKGs/Labs/Other Studies Reviewed:    The following studies were reviewed today:   EKG:    Recent Labs: 07/15/2022: BUN 12; Creatinine, Ser 0.92; Potassium 4.1; Sodium 142  Recent Lipid Panel    Component Value Date/Time   CHOL 164 06/11/2020 0913   TRIG 81 06/11/2020 0913   HDL 50 06/11/2020 0913   CHOLHDL 3.3 06/11/2020 0913   CHOLHDL 2.8 10/02/2016 1452   VLDL 14 10/02/2016 1452   LDLCALC 99 06/11/2020 0913     Risk Assessment/Calculations:        Physical Exam:    Physical Exam: Blood pressure 116/74, pulse 88, height 5\' 3"  (1.6 m), weight 193 lb (87.5 kg), last menstrual period 06/08/2005, SpO2 97%.       GEN:  Well nourished, well developed in no acute distress HEENT: Normal NECK: No JVD; No carotid bruits LYMPHATICS: No lymphadenopathy CARDIAC: RRR , no murmurs, rubs, gallops RESPIRATORY:  Clear to auscultation without rales, wheezing or rhonchi  ABDOMEN: Soft, non-tender, non-distended MUSCULOSKELETAL:  No edema; No deformity  SKIN: Warm and dry NEUROLOGIC:  Alert and oriented x 3   ASSESSMENT:    1. Non-cardiac chest pain     PLAN:      Palpitations: She  denies any recent palpitations.   2.  Chest pain: Coronary CT angiogram is normal.   3.  Obstructive sleep apnea: She has an oral device that is helping her with her obstructive sleep apnea.  4.  Patent foramen ovale: She has an incidentally found patent  foramen ovale.  This is not causing her any trouble.  There is no follow-up needed for this.            Medication Adjustments/Labs and Tests Ordered: Current medicines are reviewed at length with the patient today.  Concerns regarding medicines are outlined above.  No orders of the defined types were placed in this encounter.  No orders of the defined types were placed in this encounter.   Patient Instructions  Medication Instructions:  Your physician recommends that you continue on your current medications as directed. Please refer to the Current Medication list given to you today.  *If you need a refill on your cardiac medications before your next appointment, please call your pharmacy*   Lab Work: NONE If you have labs (blood work) drawn today and your tests are completely normal, you will receive your results only by: MyChart Message (if you have MyChart) OR A paper copy in the mail If you have any lab test that is abnormal or we need to change your treatment, we will call you to review the results.   Testing/Procedures: NONE   Follow-Up: At [facility], you and your health needs are our priority.  As part of our continuing mission to provide you with exceptional heart care, we have created designated Provider Care Teams.  These Care Teams include your primary Cardiologist (physician) and Advanced Practice Providers (APPs -  Physician Assistants and Nurse Practitioners) who all work together to provide you with the care you need, when you need it.  We recommend signing up for the patient portal called "MyChart".  Sign up information is provided on this After Visit Summary.  MyChart is used to connect with  patients for Virtual Visits (Telemedicine).  Patients are able to view lab/test results, encounter notes, upcoming appointments, etc.  Non-urgent messages can be sent to your provider as well.   To learn more about what you can do with MyChart, go to [email or web address].    Your next appointment:   As Needed  Provider:   Kristeen Miss, MD       Signed, Kristeen Miss, MD  01/05/2023 11:44 AM    [facility]

## 2023-01-05 ENCOUNTER — Encounter: Payer: Self-pay | Admitting: Cardiovascular Disease

## 2023-01-05 ENCOUNTER — Ambulatory Visit: Payer: BC Managed Care – PPO | Attending: Cardiovascular Disease | Admitting: Cardiovascular Disease

## 2023-01-05 VITALS — BP 116/74 | HR 88 | Ht 63.0 in | Wt 193.0 lb

## 2023-01-05 DIAGNOSIS — R0789 Other chest pain: Secondary | ICD-10-CM

## 2023-01-05 NOTE — Patient Instructions (Signed)
Medication Instructions:  Your physician recommends that you continue on your current medications as directed. Please refer to the Current Medication list given to you today.  *If you need a refill on your cardiac medications before your next appointment, please call your pharmacy*   Lab Work: NONE If you have labs (blood work) drawn today and your tests are completely normal, you will receive your results only by: MyChart Message (if you have MyChart) OR A paper copy in the mail If you have any lab test that is abnormal or we need to change your treatment, we will call you to review the results.   Testing/Procedures: NONE   Follow-Up: At [facility], you and your health needs are our priority.  As part of our continuing mission to provide you with exceptional heart care, we have created designated Provider Care Teams.  These Care Teams include your primary Cardiologist (physician) and Advanced Practice Providers (APPs -  Physician Assistants and Nurse Practitioners) who all work together to provide you with the care you need, when you need it.  We recommend signing up for the patient portal called "MyChart".  Sign up information is provided on this After Visit Summary.  MyChart is used to connect with patients for Virtual Visits (Telemedicine).  Patients are able to view lab/test results, encounter notes, upcoming appointments, etc.  Non-urgent messages can be sent to your provider as well.   To learn more about what you can do with MyChart, go to [email or web address].    Your next appointment:   As Needed  Provider:   Philip Nahser, MD   

## 2023-04-22 ENCOUNTER — Other Ambulatory Visit: Payer: Self-pay | Admitting: Obstetrics and Gynecology

## 2023-04-22 DIAGNOSIS — Z1231 Encounter for screening mammogram for malignant neoplasm of breast: Secondary | ICD-10-CM

## 2023-05-27 ENCOUNTER — Ambulatory Visit
Admission: RE | Admit: 2023-05-27 | Discharge: 2023-05-27 | Disposition: A | Discharge status: Home / Self Care | Payer: BC Managed Care – PPO | Source: Ambulatory Visit | Attending: Obstetrics and Gynecology | Admitting: Obstetrics and Gynecology

## 2023-05-27 DIAGNOSIS — Z1231 Encounter for screening mammogram for malignant neoplasm of breast: Secondary | ICD-10-CM

## 2023-07-01 ENCOUNTER — Ambulatory Visit (INDEPENDENT_AMBULATORY_CARE_PROVIDER_SITE_OTHER): Payer: 59 | Admitting: Family Medicine

## 2023-07-01 ENCOUNTER — Encounter: Payer: Self-pay | Admitting: Family Medicine

## 2023-07-01 VITALS — BP 122/81 | HR 73 | Ht 63.0 in | Wt 186.4 lb

## 2023-07-01 DIAGNOSIS — Z0000 Encounter for general adult medical examination without abnormal findings: Secondary | ICD-10-CM

## 2023-07-01 DIAGNOSIS — E611 Iron deficiency: Secondary | ICD-10-CM

## 2023-07-01 DIAGNOSIS — E669 Obesity, unspecified: Secondary | ICD-10-CM

## 2023-07-01 DIAGNOSIS — L292 Pruritus vulvae: Secondary | ICD-10-CM | POA: Diagnosis not present

## 2023-07-01 DIAGNOSIS — Z789 Other specified health status: Secondary | ICD-10-CM

## 2023-07-01 NOTE — Progress Notes (Signed)
   Annual physical  Subjective    Patient ID: Danielle Arellano, female    DOB: Nov 15, 1965  Age: 58 y.o. MRN: [id number]  Chief Complaint  Patient presents with   Annual Exam   HPI Danielle Arellano is a 58 y.o. old female here  for annual exam.   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  She is married and has 2 adult children.  Oldest child just graduated from [employer].  No grandchildren.  Does not use tobacco, drinks alcohol occasionally.  Does not use recreational drugs.  Eats a regular diet and exercises occasionally by going on walks.  Patient had a hysterectomy in 2006.  Patient uses clobetasol and occasionally tacrolimus topically for vaginal rash.  Has had this worked up by a Armed forces operational officer.  She also takes Zyrtec to help with itching.  She is unsure exactly if the actual cause of her rash was ever discovered.   The 10-year ASCVD risk score (Arnett DK, et al., 2019) is: 3.7%  Health Maintenance Due  Topic Date Due   COVID-19 Vaccine (5 - 2024-25 season) 02/07/2023      Objective:     BP 122/81   Pulse 73   Ht 5\' 3"  (1.6 m)   Wt 186 lb 6.4 oz (84.6 kg)   LMP 06/08/2005 (Approximate)   SpO2 98%   BMI 33.02 kg/m    Physical Exam General: Alert, oriented HEENT: PERRLA, EOMI, moist mucous CV: Regular rate and rhythm Pulmonary: Lungs clear bilaterally GI: Soft, normal bowel sounds MSK: Strength equal bilaterally, normal gait Extremities: No pedal edema    Assessment & Plan:   Physical exam, annual  Obesity (BMI 30-39.9) -     Hemoglobin A1c -     CBC -     Comprehensive metabolic panel -     VITAMIN D 25 Hydroxy (Vit-D Deficiency, Fractures) -     Lipid panel  Vulvar itching Assessment & Plan: Continuing to use clobetasol and tacrolimus as needed.     Iron deficiency Assessment & Plan: Continues to take iron.  Will check cbc and iron panel.     Orders: -     Iron, TIBC and Ferritin Panel  Other orders -     Lipid panel     Return in about 1 year (around 06/30/2024) for physical.    Sandre Kitty, MD

## 2023-07-01 NOTE — Assessment & Plan Note (Signed)
Continuing to use clobetasol and tacrolimus as needed.

## 2023-07-01 NOTE — Assessment & Plan Note (Signed)
Continues to take iron.  Will check cbc and iron panel.

## 2023-07-01 NOTE — Patient Instructions (Signed)
It was nice to see you today,  We addressed the following topics today: -I have ordered some labs.  I will let you know the results of these when I get them.  Have a great day,  Frederic Jericho, MD

## 2023-07-02 ENCOUNTER — Encounter: Payer: Self-pay | Admitting: Family Medicine

## 2023-07-02 LAB — HEMOGLOBIN A1C
Est. average glucose Bld gHb Est-mCnc: 126 mg/dL
Hgb A1c MFr Bld: 6 % — ABNORMAL HIGH (ref 4.8–5.6)

## 2023-07-02 LAB — LIPID PANEL
Chol/HDL Ratio: 3.4 {ratio} (ref 0.0–4.4)
Cholesterol, Total: 161 mg/dL (ref 100–199)
HDL: 47 mg/dL (ref >39)
LDL Chol Calc (NIH): 102 mg/dL — ABNORMAL HIGH (ref 0–99)
Triglycerides: 62 mg/dL (ref 0–149)
VLDL Cholesterol Cal: 12 mg/dL (ref 5–40)

## 2023-07-02 LAB — COMPREHENSIVE METABOLIC PANEL
ALT: 37 [IU]/L — ABNORMAL HIGH (ref 0–32)
AST: 30 [IU]/L (ref 0–40)
Albumin: 4.3 g/dL (ref 3.8–4.9)
Alkaline Phosphatase: 79 [IU]/L (ref 44–121)
BUN/Creatinine Ratio: 16 (ref 9–23)
BUN: 13 mg/dL (ref 6–24)
Bilirubin Total: 0.4 mg/dL (ref 0.0–1.2)
CO2: 24 mmol/L (ref 20–29)
Calcium: 9.6 mg/dL (ref 8.7–10.2)
Chloride: 103 mmol/L (ref 96–106)
Creatinine, Ser: 0.83 mg/dL (ref 0.57–1.00)
Globulin, Total: 2.9 g/dL (ref 1.5–4.5)
Glucose: 97 mg/dL (ref 70–99)
Potassium: 4.2 mmol/L (ref 3.5–5.2)
Sodium: 143 mmol/L (ref 134–144)
Total Protein: 7.2 g/dL (ref 6.0–8.5)
eGFR: 81 mL/min/{1.73_m2} (ref >59)

## 2023-07-02 LAB — IRON,TIBC AND FERRITIN PANEL
Ferritin: 29 ng/mL (ref 15–150)
Iron Saturation: 14 % — ABNORMAL LOW (ref 15–55)
Iron: 51 ug/dL (ref 27–159)
Total Iron Binding Capacity: 376 ug/dL (ref 250–450)
UIBC: 325 ug/dL (ref 131–425)

## 2023-07-02 LAB — CBC
Hematocrit: 42.5 % (ref 34.0–46.6)
Hemoglobin: 13.9 g/dL (ref 11.1–15.9)
MCH: 29.3 pg (ref 26.6–33.0)
MCHC: 32.7 g/dL (ref 31.5–35.7)
MCV: 90 fL (ref 79–97)
Platelets: 255 10*3/uL (ref 150–450)
RBC: 4.74 x10E6/uL (ref 3.77–5.28)
RDW: 13.7 % (ref 11.7–15.4)
WBC: 3.2 10*3/uL — ABNORMAL LOW (ref 3.4–10.8)

## 2023-07-02 LAB — VITAMIN D 25 HYDROXY (VIT D DEFICIENCY, FRACTURES): Vit D, 25-Hydroxy: 60 ng/mL (ref 30.0–100.0)

## 2023-08-12 ENCOUNTER — Encounter (HOSPITAL_BASED_OUTPATIENT_CLINIC_OR_DEPARTMENT_OTHER): Payer: Self-pay | Admitting: Pulmonary Disease

## 2023-08-12 ENCOUNTER — Ambulatory Visit (HOSPITAL_BASED_OUTPATIENT_CLINIC_OR_DEPARTMENT_OTHER): Payer: 59 | Admitting: Pulmonary Disease

## 2023-08-12 VITALS — BP 126/78 | HR 84 | Ht 63.0 in | Wt 192.4 lb

## 2023-08-12 DIAGNOSIS — G4733 Obstructive sleep apnea (adult) (pediatric): Secondary | ICD-10-CM | POA: Diagnosis not present

## 2023-08-12 NOTE — Patient Instructions (Signed)
 Happy that oral device is working for you  Call as needed

## 2023-08-12 NOTE — Progress Notes (Signed)
   Subjective:    Patient ID: Danielle Arellano, female    DOB: 22-May-1966, 58 y.o.   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 She reports a significant improvement in her symptoms with the oral device, stating that it is "going great" and is "way better" than the CPAP machine. She has an upcoming appointment with Dr. Toni Arthurs to monitor the alignment of the device. The patient also mentions a recent weight gain, acknowledging that there is a correlation between weight and sleep apnea severity. She reports that when she had previously lost weight, she noticed an improvement in her symptoms.  Significant tests/ events reviewed   NPSG 10/2018 >> mild OSA, events were only noted during REM sleep, AHI 9/h, REM RDI 32/h     Review of Systems neg for any significant sore throat, dysphagia, itching, sneezing, nasal congestion or excess/ purulent secretions, fever, chills, sweats, unintended wt loss, pleuritic or exertional cp, hempoptysis, orthopnea pnd or change in chronic leg swelling. Also denies presyncope, palpitations, heartburn, abdominal pain, nausea, vomiting, diarrhea or change in bowel or urinary habits, dysuria,hematuria, rash, arthralgias, visual complaints, headache, numbness weakness or ataxia.     Objective:   Physical Exam  Gen. Pleasant, obese, in no distress ENT - no lesions, no post nasal drip Neck: No JVD, no thyromegaly, no carotid bruits Lungs: no use of accessory muscles, no dullness to percussion, decreased without rales or rhonchi  Cardiovascular: Rhythm regular, heart sounds  normal, no murmurs or gallops, no peripheral edema Musculoskeletal: No deformities, no cyanosis or clubbing , no tremors       Assessment & Plan:    OSA  -she has settled down with dental appliance.  Daughter  distributed for sleep study with the ring and residual AHI was down to 3.5/hour.  She feels well rested and reports.  Good energy levels. Weight is unchanged from prior. Weight loss encouraged

## 2024-05-11 ENCOUNTER — Other Ambulatory Visit: Payer: Self-pay | Admitting: Obstetrics and Gynecology

## 2024-05-11 DIAGNOSIS — Z1231 Encounter for screening mammogram for malignant neoplasm of breast: Secondary | ICD-10-CM

## 2024-06-14 ENCOUNTER — Ambulatory Visit
Admission: RE | Admit: 2024-06-14 | Discharge: 2024-06-14 | Disposition: A | Discharge status: Home / Self Care | Source: Ambulatory Visit | Attending: Obstetrics and Gynecology | Admitting: Obstetrics and Gynecology

## 2024-06-14 DIAGNOSIS — Z1231 Encounter for screening mammogram for malignant neoplasm of breast: Secondary | ICD-10-CM

## 2024-06-16 ENCOUNTER — Ambulatory Visit: Payer: Self-pay | Admitting: Obstetrics and Gynecology

## 2024-06-30 ENCOUNTER — Ambulatory Visit: Payer: 59 | Admitting: Family Medicine

## 2024-06-30 ENCOUNTER — Encounter: Payer: Self-pay | Admitting: Family Medicine

## 2024-06-30 VITALS — BP 115/79 | HR 75 | Ht 63.0 in | Wt 188.4 lb

## 2024-06-30 DIAGNOSIS — E7800 Pure hypercholesterolemia, unspecified: Secondary | ICD-10-CM | POA: Diagnosis not present

## 2024-06-30 DIAGNOSIS — L439 Lichen planus, unspecified: Secondary | ICD-10-CM

## 2024-06-30 DIAGNOSIS — R7401 Elevation of levels of liver transaminase levels: Secondary | ICD-10-CM

## 2024-06-30 DIAGNOSIS — Z0000 Encounter for general adult medical examination without abnormal findings: Secondary | ICD-10-CM | POA: Diagnosis not present

## 2024-06-30 DIAGNOSIS — R7303 Prediabetes: Secondary | ICD-10-CM

## 2024-06-30 DIAGNOSIS — E611 Iron deficiency: Secondary | ICD-10-CM

## 2024-06-30 NOTE — Assessment & Plan Note (Signed)
 Managed with clobetasol  as needed during flare-ups. - Continue clobetasol  as needed for flare-ups.

## 2024-06-30 NOTE — Assessment & Plan Note (Signed)
 Mildly elevated ALT likely due to metabolic factors. Minimal alcohol consumption. - Repeated ALT to monitor liver enzyme levels. - Advised lifestyle modifications: maintain healthy weight, regular exercise, reduce refined sugars and saturated fats.

## 2024-06-30 NOTE — Progress Notes (Signed)
" ° ° °  Subjective   Patient ID: Danielle Arellano, female    DOB: 09-28-1965  Age: 59 y.o. MRN: [id number]  Chief Complaint  Patient presents with   Annual Exam     History of Present Illness   Danielle Arellano is a 59 year old female who presents for a routine follow-up visit.  She continues iron supplements every other day and uses clobetasol  as needed for skin flare-ups, with rare tacrolimus  use. She is unsure of flare triggers.  Her lipid panel last year was normal. ALT was mildly elevated. She does not use vitamin D  supplements but takes a multivitamin and gets sun exposure. She drinks alcohol rarely and is working to limit processed sugars and saturated fats.  She has home exercise equipment but struggles with motivation for regular exercise and has not yet committed to structured classes or a gym.  She received a flu shot three weeks ago but has not yet received pneumococcal vaccination. She turned 58 last summer and feels generally healthy.          The 10-year ASCVD risk score (Arnett DK, et al., 2019) is: 3.4%  Health Maintenance Due  Topic Date Due   COVID-19 Vaccine (5 - 2025-26 season) 02/07/2024      Objective:     BP 115/79   Pulse 75   Ht 5' 3 (1.6 m)   Wt 188 lb 6.4 oz (85.5 kg)   LMP 06/08/2005   SpO2 100%   BMI 33.37 kg/m    Physical Exam   Gen: alert, oriented HEENT: perrla, eomi, mmm CV: rrr, no murmur Pulm: lctab. No wheeze or crackles.  GI: soft, nbs.  Nontender to palpation MSK: strength equal b/l. Normal gait Ext: no pedal edema Skin: warm and dry, no rashes Psych: pleasant affect.  Spontaneous speech       No results found for any visits on 06/30/24.      Assessment & Plan:   Physical exam, annual  Iron deficiency Assessment & Plan: Managed with iron supplementation. - Continue iron supplementation every other day. -check cbc and iron today  Orders: -     CBC with Differential/Platelet -      Ferritin  Prediabetes -     Hemoglobin A1c  Elevated ALT measurement Assessment & Plan: Mildly elevated ALT likely due to metabolic factors. Minimal alcohol consumption. - Repeated ALT to monitor liver enzyme levels. - Advised lifestyle modifications: maintain healthy weight, regular exercise, reduce refined sugars and saturated fats.  Orders: -     Comprehensive metabolic panel with GFR  Elevated LDL cholesterol level -     Comprehensive metabolic panel with GFR -     Lipid panel  Lichen planus Assessment & Plan: Managed with clobetasol  as needed during flare-ups. - Continue clobetasol  as needed for flare-ups.         Return in about 1 year (around 06/30/2025) for physical.    Toribio MARLA Slain, MD  "

## 2024-06-30 NOTE — Patient Instructions (Signed)
" ° °  YOUR PLAN: FATTY LIVER DISEASE: You have mildly elevated liver enzymes likely due to metabolic factors and minimal alcohol consumption. -We repeated your ALT test to monitor liver enzyme levels. -Maintain a healthy weight, engage in regular exercise, and reduce your intake of refined sugars and saturated fats.  -Continue taking your iron supplements every other day.  -Continue using clobetasol  as needed for flare-ups.  -you can schedule the pneumococcal vaccination if you decide you would like it.SABRA   "

## 2024-06-30 NOTE — Assessment & Plan Note (Signed)
 Managed with iron supplementation. - Continue iron supplementation every other day. -check cbc and iron today

## 2024-07-01 LAB — CBC WITH DIFFERENTIAL/PLATELET
Basophils Absolute: 0 10*3/uL (ref 0.0–0.2)
Basos: 1 %
EOS (ABSOLUTE): 0.1 10*3/uL (ref 0.0–0.4)
Eos: 3 %
Hematocrit: 44.2 % (ref 34.0–46.6)
Hemoglobin: 14.3 g/dL (ref 11.1–15.9)
Immature Grans (Abs): 0 10*3/uL (ref 0.0–0.1)
Immature Granulocytes: 0 %
Lymphocytes Absolute: 1.7 10*3/uL (ref 0.7–3.1)
Lymphs: 47 %
MCH: 29 pg (ref 26.6–33.0)
MCHC: 32.4 g/dL (ref 31.5–35.7)
MCV: 90 fL (ref 79–97)
Monocytes Absolute: 0.4 10*3/uL (ref 0.1–0.9)
Monocytes: 10 %
Neutrophils Absolute: 1.4 10*3/uL (ref 1.4–7.0)
Neutrophils: 39 %
Platelets: 265 10*3/uL (ref 150–450)
RBC: 4.93 x10E6/uL (ref 3.77–5.28)
RDW: 13.9 % (ref 11.7–15.4)
WBC: 3.6 10*3/uL (ref 3.4–10.8)

## 2024-07-01 LAB — COMPREHENSIVE METABOLIC PANEL WITH GFR
ALT: 38 [IU]/L — ABNORMAL HIGH (ref 0–32)
AST: 31 [IU]/L (ref 0–40)
Albumin: 4.2 g/dL (ref 3.8–4.9)
Alkaline Phosphatase: 93 [IU]/L (ref 49–135)
BUN/Creatinine Ratio: 16 (ref 12–28)
BUN: 14 mg/dL (ref 8–27)
Bilirubin Total: 0.4 mg/dL (ref 0.0–1.2)
CO2: 24 mmol/L (ref 20–29)
Calcium: 9.6 mg/dL (ref 8.7–10.3)
Chloride: 105 mmol/L (ref 96–106)
Creatinine, Ser: 0.85 mg/dL (ref 0.57–1.00)
Globulin, Total: 3 g/dL (ref 1.5–4.5)
Glucose: 104 mg/dL — ABNORMAL HIGH (ref 70–99)
Potassium: 4.2 mmol/L (ref 3.5–5.2)
Sodium: 143 mmol/L (ref 134–144)
Total Protein: 7.2 g/dL (ref 6.0–8.5)
eGFR: 78 mL/min/{1.73_m2}

## 2024-07-01 LAB — HEMOGLOBIN A1C
Est. average glucose Bld gHb Est-mCnc: 126 mg/dL
Hgb A1c MFr Bld: 6 % — ABNORMAL HIGH (ref 4.8–5.6)

## 2024-07-01 LAB — FERRITIN: Ferritin: 30 ng/mL (ref 15–150)

## 2024-07-01 LAB — LIPID PANEL
Chol/HDL Ratio: 3.4 ratio (ref 0.0–4.4)
Cholesterol, Total: 169 mg/dL (ref 100–199)
HDL: 49 mg/dL
LDL Chol Calc (NIH): 106 mg/dL — ABNORMAL HIGH (ref 0–99)
Triglycerides: 71 mg/dL (ref 0–149)
VLDL Cholesterol Cal: 14 mg/dL (ref 5–40)

## 2024-07-03 ENCOUNTER — Ambulatory Visit: Payer: Self-pay | Admitting: Family Medicine

## 2025-06-25 ENCOUNTER — Other Ambulatory Visit

## 2025-07-02 ENCOUNTER — Encounter: Admitting: Family Medicine
